# Patient Record
Sex: Female | Born: 1975 | Race: White | Hispanic: No | Smoking: Never smoker
Health system: Southern US, Community
[De-identification: ages and names within clinical notes are randomized; demographics above are authoritative.]

## PROBLEM LIST (undated history)

## (undated) DIAGNOSIS — J45909 Unspecified asthma, uncomplicated: Secondary | ICD-10-CM

## (undated) DIAGNOSIS — F419 Anxiety disorder, unspecified: Secondary | ICD-10-CM

## (undated) DIAGNOSIS — F329 Major depressive disorder, single episode, unspecified: Secondary | ICD-10-CM

## (undated) DIAGNOSIS — O24419 Gestational diabetes mellitus in pregnancy, unspecified control: Secondary | ICD-10-CM

## (undated) DIAGNOSIS — R011 Cardiac murmur, unspecified: Secondary | ICD-10-CM

## (undated) DIAGNOSIS — IMO0001 Reserved for inherently not codable concepts without codable children: Secondary | ICD-10-CM

## (undated) DIAGNOSIS — F32A Depression, unspecified: Secondary | ICD-10-CM

## (undated) DIAGNOSIS — G2581 Restless legs syndrome: Secondary | ICD-10-CM

## (undated) DIAGNOSIS — E559 Vitamin D deficiency, unspecified: Secondary | ICD-10-CM

## (undated) DIAGNOSIS — K449 Diaphragmatic hernia without obstruction or gangrene: Secondary | ICD-10-CM

## (undated) DIAGNOSIS — J189 Pneumonia, unspecified organism: Secondary | ICD-10-CM

## (undated) DIAGNOSIS — K760 Fatty (change of) liver, not elsewhere classified: Secondary | ICD-10-CM

## (undated) DIAGNOSIS — N390 Urinary tract infection, site not specified: Secondary | ICD-10-CM

## (undated) DIAGNOSIS — K219 Gastro-esophageal reflux disease without esophagitis: Secondary | ICD-10-CM

## (undated) DIAGNOSIS — K8020 Calculus of gallbladder without cholecystitis without obstruction: Secondary | ICD-10-CM

## (undated) DIAGNOSIS — K259 Gastric ulcer, unspecified as acute or chronic, without hemorrhage or perforation: Secondary | ICD-10-CM

## (undated) DIAGNOSIS — D509 Iron deficiency anemia, unspecified: Secondary | ICD-10-CM

## (undated) DIAGNOSIS — D649 Anemia, unspecified: Secondary | ICD-10-CM

## (undated) DIAGNOSIS — B029 Zoster without complications: Secondary | ICD-10-CM

## (undated) HISTORY — DX: Calculus of gallbladder without cholecystitis without obstruction: K80.20

## (undated) HISTORY — DX: Gastric ulcer, unspecified as acute or chronic, without hemorrhage or perforation: K25.9

## (undated) HISTORY — PX: WISDOM TOOTH EXTRACTION: SHX21

## (undated) HISTORY — DX: Diaphragmatic hernia without obstruction or gangrene: K44.9

## (undated) HISTORY — DX: Iron deficiency anemia, unspecified: D50.9

## (undated) HISTORY — DX: Gastro-esophageal reflux disease without esophagitis: K21.9

## (undated) HISTORY — DX: Restless legs syndrome: G25.81

## (undated) HISTORY — DX: Depression, unspecified: F32.A

## (undated) HISTORY — DX: Anemia, unspecified: D64.9

## (undated) HISTORY — DX: Fatty (change of) liver, not elsewhere classified: K76.0

## (undated) HISTORY — DX: Major depressive disorder, single episode, unspecified: F32.9

## (undated) HISTORY — DX: Vitamin D deficiency, unspecified: E55.9

## (undated) HISTORY — DX: Unspecified asthma, uncomplicated: J45.909

---

## 2013-07-24 ENCOUNTER — Telehealth: Payer: Self-pay | Admitting: Neurology

## 2013-07-25 ENCOUNTER — Encounter: Payer: Self-pay | Admitting: Neurology

## 2013-07-28 ENCOUNTER — Ambulatory Visit: Payer: Managed Care, Other (non HMO) | Admitting: Neurology

## 2013-12-10 ENCOUNTER — Encounter: Payer: Self-pay | Admitting: Gastroenterology

## 2014-02-16 ENCOUNTER — Ambulatory Visit: Payer: Self-pay | Admitting: Gastroenterology

## 2014-02-17 NOTE — Telephone Encounter (Signed)
Error

## 2014-02-17 NOTE — Progress Notes (Signed)
Patient ID: Leslie Robinson, female   DOB: 1975/11/08, 38 y.o.   MRN: [id number]   The patient's chart has been reviewed by Dr. Fuller Plan and the recommendations are noted below.  Follow-up advised. Contact patient and schedule visit at first available appointment.  Outcome of communication with the patient: Pt's numbers are disconnected. Will send letter.

## 2014-11-27 HISTORY — PX: TUBAL LIGATION: SHX77

## 2014-12-16 ENCOUNTER — Encounter: Payer: Self-pay | Admitting: Internal Medicine

## 2015-01-05 ENCOUNTER — Encounter: Payer: Self-pay | Admitting: *Deleted

## 2015-01-12 ENCOUNTER — Ambulatory Visit: Payer: Self-pay | Admitting: Internal Medicine

## 2015-03-10 ENCOUNTER — Ambulatory Visit (INDEPENDENT_AMBULATORY_CARE_PROVIDER_SITE_OTHER): Payer: BLUE CROSS/BLUE SHIELD | Admitting: Internal Medicine

## 2015-03-10 ENCOUNTER — Encounter: Payer: Self-pay | Admitting: Internal Medicine

## 2015-03-10 ENCOUNTER — Other Ambulatory Visit (INDEPENDENT_AMBULATORY_CARE_PROVIDER_SITE_OTHER): Payer: BLUE CROSS/BLUE SHIELD

## 2015-03-10 VITALS — BP 144/86 | HR 80 | Ht 64.0 in | Wt 220.1 lb

## 2015-03-10 DIAGNOSIS — Z8371 Family history of colonic polyps: Secondary | ICD-10-CM | POA: Diagnosis not present

## 2015-03-10 DIAGNOSIS — R195 Other fecal abnormalities: Secondary | ICD-10-CM

## 2015-03-10 DIAGNOSIS — D509 Iron deficiency anemia, unspecified: Secondary | ICD-10-CM

## 2015-03-10 DIAGNOSIS — D649 Anemia, unspecified: Secondary | ICD-10-CM | POA: Diagnosis not present

## 2015-03-10 LAB — IBC PANEL
IRON: 48 ug/dL (ref 42–145)
Saturation Ratios: 9.9 % — ABNORMAL LOW (ref 20.0–50.0)
Transferrin: 346 mg/dL (ref 212.0–360.0)

## 2015-03-10 LAB — CBC WITH DIFFERENTIAL/PLATELET
BASOS PCT: 0.5 % (ref 0.0–3.0)
Basophils Absolute: 0 10*3/uL (ref 0.0–0.1)
EOS PCT: 0.5 % (ref 0.0–5.0)
Eosinophils Absolute: 0 10*3/uL (ref 0.0–0.7)
HEMATOCRIT: 32.5 % — AB (ref 36.0–46.0)
HEMOGLOBIN: 11 g/dL — AB (ref 12.0–15.0)
LYMPHS PCT: 19.4 % (ref 12.0–46.0)
Lymphs Abs: 1.1 10*3/uL (ref 0.7–4.0)
MCHC: 33.8 g/dL (ref 30.0–36.0)
MCV: 92 fl (ref 78.0–100.0)
MONO ABS: 0.2 10*3/uL (ref 0.1–1.0)
MONOS PCT: 4.1 % (ref 3.0–12.0)
Neutro Abs: 4.5 10*3/uL (ref 1.4–7.7)
Neutrophils Relative %: 75.5 % (ref 43.0–77.0)
Platelets: 247 10*3/uL (ref 150.0–400.0)
RBC: 3.53 Mil/uL — AB (ref 3.87–5.11)
RDW: 16.3 % — AB (ref 11.5–15.5)
WBC: 5.9 10*3/uL (ref 4.0–10.5)

## 2015-03-10 LAB — FERRITIN: FERRITIN: 8.8 ng/mL — AB (ref 10.0–291.0)

## 2015-03-10 LAB — IGA: IGA: 297 mg/dL (ref 68–378)

## 2015-03-10 MED ORDER — NA SULFATE-K SULFATE-MG SULF 17.5-3.13-1.6 GM/177ML PO SOLN
ORAL | Status: DC
Start: 2015-03-10 — End: 2015-06-09

## 2015-03-10 NOTE — Progress Notes (Signed)
Patient ID: Leslie Robinson, female   DOB: [birth year], 39 y.o.   MRN: [id number] HPI: Leslie Robinson is a 39 year old with past medical history of IDA, GERD, vitamin D deficiency and restless leg who seen in consultation at the request of Dr. Laney Pastor to evaluate heme positive stool and iron deficiency anemia. She is here alone today. She reports that overall she feels okay but she has been dealing with fatigue and generalized weakness for about 1 year. She reports that she has had persistent anemia despite oral iron supplementation. She is taking ferrous sulfate 325 mg 3 or 4 days a week. She has been stopping it after 3 or 4 days because of constipation. Stool softeners have not helped. She does report heavy menstrual periods lasting 7 days but this is not a change for her. She has not seen blood in her stool or melena. Bowel movements a been regular except darker when using iron and more hard stool when using iron. No diarrhea. No abdominal pain. No upper GI complaint including heartburn, dysphagia or odynophagia. She denies early satiety, nausea and vomiting. Family history notable for mother with colon polyps, multiple maternal cousins with colon polyps and a maternal grandmother with colon cancer at an early age, age unknown but less than 62 years old. No known family history of celiac disease. She had a colonoscopy performed approximately 15 years ago by Dr. Docia Furl which was reportedly normal. 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 She recently had a baby in May 2016. She also has an 15 and 17 year old child. Does not use tobacco or alcohol. She is recently started taking phentermine for weight loss though has not found it effective.  Past Medical History  Diagnosis Date  . Anemia   . Iron deficiency anemia   . Restless leg   . Vitamin D deficiency   . GERD (gastroesophageal reflux disease)     Past Surgical History  Procedure Laterality Date  . Tubal  ligation  11-27-2014    Outpatient Prescriptions Prior to Visit  Medication Sig Dispense Refill  . pramipexole (MIRAPEX) 0.125 MG tablet Take 0.125 mg by mouth daily. Once daily taken 2-3 hours before bedtime    . amitriptyline (ELAVIL) 25 MG tablet Take 25 mg by mouth at bedtime.    . clotrimazole-betamethasone (LOTRISONE) cream Apply 1 application topically 2 (two) times daily. Apply small amount to affected area    . miconazole (ZEASORB-AF) 2 % powder Apply topically as needed for itching. Apply to affected to area bid    . oxyCODONE-acetaminophen (PERCOCET) 10-325 MG per tablet Take 1 tablet by mouth daily. One tablet po qhs for pain     No facility-administered medications prior to visit.    No Known Allergies  Family History  Problem Relation Age of Onset  . Hypertension Mother   . Colon cancer Maternal Grandmother   . Colon polyps Mother   . Diabetes Mother   . Heart disease Mother     Social History  Substance Use Topics  . Smoking status: Never Smoker   . Smokeless tobacco: Never Used  . Alcohol Use: No    ROS: As per history of present illness, otherwise negative  BP 144/86 mmHg  Pulse 80  Ht 5\' 4"  (1.626 m)  Wt 220 lb 2 oz (99.848 kg)  BMI 37.77 kg/m2  LMP 02/14/2015 (Approximate) Constitutional: Well-developed and well-nourished. No distress. HEENT: Normocephalic and atraumatic. Oropharynx is clear and moist. No oropharyngeal exudate. Conjunctivae are  normal.  No scleral icterus. Neck: Neck supple. Trachea midline. Cardiovascular: Normal rate, regular rhythm and intact distal pulses. No M/R/G Pulmonary/chest: Effort normal and breath sounds normal. No wheezing, rales or rhonchi. Abdominal: Soft, nontender, nondistended. Bowel sounds active throughout. There are no masses palpable. No hepatosplenomegaly. Extremities: no clubbing, cyanosis, or edema Lymphadenopathy: No cervical adenopathy noted. Neurological: Alert and oriented to person place and time. Skin:  Skin is warm and dry. No rashes noted. Psychiatric: Normal mood and affect. Behavior is normal.  RELEVANT LABS AND IMAGING: CBC    Component Value Date/Time   WBC 5.9 03/10/2015 0948   RBC 3.53* 03/10/2015 0948   HGB 11.0* 03/10/2015 0948   HCT 32.5* 03/10/2015 0948   PLT 247.0 03/10/2015 0948   MCV 92.0 03/10/2015 0948   MCHC 33.8 03/10/2015 0948   RDW 16.3* 03/10/2015 0948   LYMPHSABS 1.1 03/10/2015 0948   MONOABS 0.2 03/10/2015 0948   EOSABS 0.0 03/10/2015 0948   BASOSABS 0.0 03/10/2015 0948   Labs reviewed from primary care 12/11/2014, hemoglobin 10.6, hepatic function panel normal TSH normal B12 normal Ferritin 17 in January 2015 Vitamin D was low and replaced  FOBT positive, patient reports these were submitted from home  ASSESSMENT/PLAN: 39 year old with past medical history of IDA, GERD, vitamin D deficiency and restless leg who seen in consultation at the request of Dr. Laney Pastor to evaluate heme positive stool and iron deficiency anemia.  1. IDA with heme positive stools -- I have recommended upper endoscopy and colonoscopy to evaluate iron deficiency and heme-positive stool. Her family history is notable for colon polyps and cancer on her mother's side, another reason for colonoscopy. We discussed the risks, benefits and alternatives and she is agreeable to proceed. I would like to repeat CBC, iron studies and check a celiac panel today. I would like her to resume oral iron with ferrous sulfate 325 mg once daily and add MiraLAX 17 g daily to avoid constipation. If oral iron replacement as an effective she may need IV iron. Lastly she will need to stop phentermine before her procedures.    TM:LYYT David Hungarland, [place] [address]

## 2015-03-10 NOTE — Patient Instructions (Addendum)
You have been scheduled for an endoscopy and colonoscopy. Please follow the written instructions given to you at your visit today. Please pick up your prep supplies at the pharmacy within the next 1-3 days. If you use inhalers (even only as needed), please bring them with you on the day of your procedure. Your physician has requested that you go to www.startemmi.com and enter the access code given to you at your visit today. This web site gives a general overview about your procedure. However, you should still follow specific instructions given to you by our office regarding your preparation for the procedure.  Your physician has requested that you go to the basement for the following lab work before leaving today: CBC, IBC, Celiac  Hold Mirapex and adipex 10 days prior to test.  Please purchase the following medications over the counter and take as directed: Miralax 17 grams (1 capful) daily

## 2015-03-11 LAB — TISSUE TRANSGLUTAMINASE, IGA: Tissue Transglutaminase Ab, IgA: 1 U/mL (ref <4)

## 2015-05-10 ENCOUNTER — Encounter: Payer: BLUE CROSS/BLUE SHIELD | Admitting: Internal Medicine

## 2015-05-23 DIAGNOSIS — J189 Pneumonia, unspecified organism: Secondary | ICD-10-CM

## 2015-05-23 HISTORY — DX: Pneumonia, unspecified organism: J18.9

## 2015-06-09 ENCOUNTER — Ambulatory Visit (AMBULATORY_SURGERY_CENTER): Payer: BLUE CROSS/BLUE SHIELD | Admitting: Internal Medicine

## 2015-06-09 ENCOUNTER — Encounter: Payer: Self-pay | Admitting: Internal Medicine

## 2015-06-09 ENCOUNTER — Other Ambulatory Visit (INDEPENDENT_AMBULATORY_CARE_PROVIDER_SITE_OTHER): Payer: BLUE CROSS/BLUE SHIELD

## 2015-06-09 VITALS — BP 161/87 | HR 84 | Temp 98.5°F | Resp 18 | Ht 64.0 in | Wt 220.0 lb

## 2015-06-09 DIAGNOSIS — K210 Gastro-esophageal reflux disease with esophagitis: Secondary | ICD-10-CM | POA: Diagnosis not present

## 2015-06-09 DIAGNOSIS — D509 Iron deficiency anemia, unspecified: Secondary | ICD-10-CM | POA: Diagnosis present

## 2015-06-09 DIAGNOSIS — Z800 Family history of malignant neoplasm of digestive organs: Secondary | ICD-10-CM | POA: Diagnosis not present

## 2015-06-09 LAB — CBC
HCT: 32.5 % — ABNORMAL LOW (ref 36.0–46.0)
Hemoglobin: 10.9 g/dL — ABNORMAL LOW (ref 12.0–15.0)
MCHC: 33.4 g/dL (ref 30.0–36.0)
MCV: 92.9 fl (ref 78.0–100.0)
Platelets: 263 10*3/uL (ref 150.0–400.0)
RBC: 3.5 Mil/uL — AB (ref 3.87–5.11)
RDW: 16.8 % — ABNORMAL HIGH (ref 11.5–15.5)
WBC: 5.6 10*3/uL (ref 4.0–10.5)

## 2015-06-09 LAB — IBC PANEL
IRON: 106 ug/dL (ref 42–145)
SATURATION RATIOS: 21.8 % (ref 20.0–50.0)
TRANSFERRIN: 348 mg/dL (ref 212.0–360.0)

## 2015-06-09 MED ORDER — PANTOPRAZOLE SODIUM 40 MG PO TBEC: 40.0000 mg | DELAYED_RELEASE_TABLET | Freq: Every day | ORAL | Status: DC

## 2015-06-09 MED ORDER — SODIUM CHLORIDE 0.9 % IV SOLN: 500.0000 mL | INTRAVENOUS | Status: DC

## 2015-06-09 NOTE — Progress Notes (Signed)
Called to room to assist during endoscopic procedure.  Patient ID and intended procedure confirmed with present staff. Received instructions for my participation in the procedure from the performing physician.  

## 2015-06-09 NOTE — Op Note (Signed)
[facility]  [address] [place], [id number]   COLONOSCOPY PROCEDURE REPORT  PATIENT: Leslie Robinson, Leslie Robinson  MR#: [id number] BIRTHDATE: 02-25-1976 , 39  yrs. old GENDER: female ENDOSCOPIST: Jerene Bears, MD REFERRED BY: [place] PROCEDURE DATE:  06/09/2015 PROCEDURE:   Colonoscopy, diagnostic First Screening Colonoscopy - Avg.  risk and is 50 yrs.  old or older - No.  Prior Negative Screening - Now for repeat screening. N/A  History of Adenoma - Now for follow-up colonoscopy & has been > or = to 3 yrs.  N/A  Polyps removed today? No Recommend repeat exam, <10 yrs? Yes high risk ASA CLASS:   Class II INDICATIONS:iron deficiency anemia and family history of colon polyps. MEDICATIONS: Monitored anesthesia care, Propofol 700 mg IV, and this was the total dose used for all procedures at this session  DESCRIPTION OF PROCEDURE:   After the risks benefits and alternatives of the procedure were thoroughly explained, informed consent was obtained.  The digital rectal exam revealed no rectal mass.   The [id number]  endoscope was introduced through the anus and advanced to the cecum, which was identified by both the appendix and ileocecal valve. No adverse events experienced. The quality of the prep was good.  (Suprep was used)  The instrument was then slowly withdrawn as the colon was fully examined. Estimated blood loss is zero unless otherwise noted in this procedure report.      COLON FINDINGS: There was mild diverticulosis noted in the ascending colon, at the hepatic flexure, and splenic flexure.   The colonic mucosa appeared normal throughout the entire examined colon. Retroflexed views revealed hypertrophied anal papillae. The time to cecum = 3.5 Withdrawal time = 11.3   The scope was withdrawn and the procedure completed. COMPLICATIONS: There were no immediate complications.  ENDOSCOPIC IMPRESSION: 1.   Mild diverticulosis was noted in the ascending  colon, at the hepatic flexure, and splenic flexure 2.   The colonic mucosa appeared normal throughout the entire examined colon  RECOMMENDATIONS: 1.  High fiber diet 2.  Repeat Colonoscopy in 5 years. 3.  Await biopsies from EGD, if persist iron deficiency anemia, consider capsule endoscopy  eSigned:  Jerene Bears, MD 06/09/2015 4:21 PM   cc:  the patient, Dr. Margo Aye

## 2015-06-09 NOTE — Op Note (Signed)
[facility]  [address] [place], [id number]   ENDOSCOPY PROCEDURE REPORT  PATIENT: Leslie, Robinson  MR#: [id number] BIRTHDATE: [birth year] , 39  yrs. old GENDER: female ENDOSCOPIST: Jerene Bears, MD REFERRED BY:  Dr. Margo Aye PROCEDURE DATE:  06/09/2015 PROCEDURE:  EGD, diagnostic and EGD w/ biopsy ASA CLASS:     Class II INDICATIONS:  iron deficiency anemia. MEDICATIONS: Monitored anesthesia care and Propofol 300 mg IV TOPICAL ANESTHETIC: none  DESCRIPTION OF PROCEDURE: After the risks benefits and alternatives of the procedure were thoroughly explained, informed consent was obtained.  The [id number] endoscope was introduced through the mouth and advanced to the second portion of the duodenum , Without limitations.  The instrument was slowly withdrawn as the mucosa was fully examined.   ESOPHAGUS: Mild reflux esophagitis was found at the gastroesophageal junction.  STOMACH: A 3 cm hiatal hernia was noted.   Three linear erosions were found in the gastric body, 1 with scant adherent heme.  Cold forcep biopsies were taken at the gastric body, antrum and angularis to evaluate for h.  pylori.  DUODENUM: The duodenal mucosa showed no abnormalities in the bulb and 2nd part of the duodenum.  Cold forcep biopsies were taken in the second portion.  Retroflexed views revealed a hiatal hernia.     The scope was then withdrawn from the patient and the procedure completed.  COMPLICATIONS: There were no immediate complications.     ENDOSCOPIC IMPRESSION: 1.   Reflux esophagitis at the gastroesophageal junction 2.   3 cm hiatal hernia 3.   Erosions were found in the gastric body; multiple biopsies 4.   The duodenal mucosa showed no abnormalities in the bulb and 2nd part of the duodenum cold forcep biopsies were taken in the second portion  RECOMMENDATIONS: 1.  Await biopsy results 2.  Pantoprazole 40 mg daily x 1 month 3.  Follow-up of  helicobacter pylori status, treat if indicated 4.  Proceed with colonoscopy   eSigned:  Jerene Bears, MD 06/09/2015 4:16 PM    CC: the patient, PCP  PATIENT NAME:  Leslie, Robinson MR#: [id number]

## 2015-06-09 NOTE — Progress Notes (Signed)
To recovery, report to Myers, RN, VSS. 

## 2015-06-09 NOTE — Patient Instructions (Addendum)

## 2015-06-10 ENCOUNTER — Telehealth: Payer: Self-pay | Admitting: *Deleted

## 2015-06-10 ENCOUNTER — Other Ambulatory Visit: Payer: Self-pay

## 2015-06-10 ENCOUNTER — Telehealth: Payer: Self-pay

## 2015-06-10 DIAGNOSIS — D509 Iron deficiency anemia, unspecified: Secondary | ICD-10-CM

## 2015-06-10 LAB — FERRITIN: Ferritin: 15.3 ng/mL (ref 10.0–291.0)

## 2015-06-10 NOTE — Telephone Encounter (Signed)
  Follow up Call-  Call back number 06/09/2015  Post procedure Call Back phone  # [phone]  Permission to leave phone message Yes     Patient questions:  Do you have a fever, pain , or abdominal swelling? No. Pain Score  0 *  Have you tolerated food without any problems? Yes.    Have you been able to return to your normal activities? Yes.    Do you have any questions about your discharge instructions: Diet   No. Medications  No. Follow up visit  No.  Do you have questions or concerns about your Care? No.  Actions: * If pain score is 4 or above: No action needed, pain <4.

## 2015-06-10 NOTE — Telephone Encounter (Signed)
Spoke with pt regarding lab results, see result note.  

## 2015-06-16 ENCOUNTER — Encounter: Payer: Self-pay | Admitting: Internal Medicine

## 2015-08-30 ENCOUNTER — Telehealth: Payer: Self-pay

## 2015-08-30 NOTE — Telephone Encounter (Signed)
-----   Message from Algernon Huxley, RN sent at 06/10/2015 10:37 AM EST ----- Regarding: Labs Pt needs labs in 3 mths, order in epic.

## 2015-08-30 NOTE — Telephone Encounter (Signed)
Pt aware.

## 2015-09-10 ENCOUNTER — Other Ambulatory Visit (INDEPENDENT_AMBULATORY_CARE_PROVIDER_SITE_OTHER): Payer: BLUE CROSS/BLUE SHIELD

## 2015-09-10 ENCOUNTER — Other Ambulatory Visit: Payer: Self-pay

## 2015-09-10 DIAGNOSIS — D509 Iron deficiency anemia, unspecified: Secondary | ICD-10-CM | POA: Diagnosis not present

## 2015-09-10 LAB — CBC WITH DIFFERENTIAL/PLATELET
Basophils Absolute: 0 10*3/uL (ref 0.0–0.1)
Basophils Relative: 0.5 % (ref 0.0–3.0)
EOS PCT: 1.4 % (ref 0.0–5.0)
Eosinophils Absolute: 0.1 10*3/uL (ref 0.0–0.7)
HCT: 31 % — ABNORMAL LOW (ref 36.0–46.0)
HEMOGLOBIN: 10.7 g/dL — AB (ref 12.0–15.0)
LYMPHS PCT: 30.4 % (ref 12.0–46.0)
Lymphs Abs: 1.4 10*3/uL (ref 0.7–4.0)
MCHC: 34.3 g/dL (ref 30.0–36.0)
MCV: 90.8 fl (ref 78.0–100.0)
MONO ABS: 0.3 10*3/uL (ref 0.1–1.0)
MONOS PCT: 7.1 % (ref 3.0–12.0)
Neutro Abs: 2.8 10*3/uL (ref 1.4–7.7)
Neutrophils Relative %: 60.6 % (ref 43.0–77.0)
Platelets: 260 10*3/uL (ref 150.0–400.0)
RBC: 3.42 Mil/uL — AB (ref 3.87–5.11)
RDW: 17 % — ABNORMAL HIGH (ref 11.5–15.5)
WBC: 4.6 10*3/uL (ref 4.0–10.5)

## 2015-09-10 LAB — IBC PANEL
IRON: 60 ug/dL (ref 42–145)
Saturation Ratios: 12.2 % — ABNORMAL LOW (ref 20.0–50.0)
Transferrin: 350 mg/dL (ref 212.0–360.0)

## 2015-09-10 LAB — FERRITIN: FERRITIN: 9.1 ng/mL — AB (ref 10.0–291.0)

## 2015-09-24 ENCOUNTER — Ambulatory Visit (INDEPENDENT_AMBULATORY_CARE_PROVIDER_SITE_OTHER): Payer: Managed Care, Other (non HMO) | Admitting: Internal Medicine

## 2015-09-24 DIAGNOSIS — D500 Iron deficiency anemia secondary to blood loss (chronic): Secondary | ICD-10-CM

## 2015-09-24 NOTE — Progress Notes (Signed)
Patient here for capsule endo .  Capsule ID [id number] exp 07/01/16 lot # [id number]

## 2015-09-30 ENCOUNTER — Encounter: Payer: Self-pay | Admitting: Internal Medicine

## 2015-10-01 ENCOUNTER — Other Ambulatory Visit: Payer: Self-pay

## 2015-10-01 ENCOUNTER — Telehealth: Payer: Self-pay

## 2015-10-01 DIAGNOSIS — M795 Residual foreign body in soft tissue: Secondary | ICD-10-CM

## 2015-10-01 DIAGNOSIS — D649 Anemia, unspecified: Secondary | ICD-10-CM

## 2015-10-01 NOTE — Telephone Encounter (Signed)
Capsule endo shows a shallow gastric ulcer, otherwise unremarkable. Fe def anemia may be due to ooze from gastric ulcer. Will check h pylori ab. Needs daily PPI, oral iron. Repeat CBC and iron studies in 3 mth. If remain iron def IV iron replacement may be indicated. Left message for pt to call back.

## 2015-10-01 NOTE — Telephone Encounter (Signed)
Patient contacted to make sure she has passed capsule endo.  She is not sure that she has seen the capsule in her stool.  She will come for a KUB at her convenience

## 2015-10-01 NOTE — Telephone Encounter (Signed)
-----   Message from Marlon Pel, RN sent at 09/24/2015  4:01 PM EDT ----- Check if she passed capsule

## 2015-10-07 ENCOUNTER — Other Ambulatory Visit: Payer: Self-pay

## 2015-10-07 DIAGNOSIS — K259 Gastric ulcer, unspecified as acute or chronic, without hemorrhage or perforation: Secondary | ICD-10-CM

## 2015-10-07 DIAGNOSIS — D509 Iron deficiency anemia, unspecified: Secondary | ICD-10-CM

## 2015-10-07 NOTE — Telephone Encounter (Signed)
Pt aware, orders and reminder in epic. 

## 2015-10-11 ENCOUNTER — Telehealth: Payer: Self-pay | Admitting: Internal Medicine

## 2015-10-11 NOTE — Telephone Encounter (Signed)
Pt wanted to know if she could be tired from anemia. Discussed with pt that anemia can make you tired and that she needs to continue taking her iron supplements. Pt states she is coming at the end of the week to have her lab for H pylori done.

## 2015-10-15 ENCOUNTER — Other Ambulatory Visit (INDEPENDENT_AMBULATORY_CARE_PROVIDER_SITE_OTHER): Payer: Managed Care, Other (non HMO)

## 2015-10-15 DIAGNOSIS — D509 Iron deficiency anemia, unspecified: Secondary | ICD-10-CM

## 2015-10-15 DIAGNOSIS — K259 Gastric ulcer, unspecified as acute or chronic, without hemorrhage or perforation: Secondary | ICD-10-CM

## 2015-10-15 LAB — H. PYLORI ANTIBODY, IGG: H PYLORI IGG: NEGATIVE

## 2015-12-02 ENCOUNTER — Emergency Department (HOSPITAL_COMMUNITY): Payer: Managed Care, Other (non HMO)

## 2015-12-02 ENCOUNTER — Emergency Department (HOSPITAL_COMMUNITY)
Admission: EM | Admit: 2015-12-02 | Discharge: 2015-12-02 | Disposition: A | Discharge status: Home / Self Care | Payer: Managed Care, Other (non HMO) | Attending: Emergency Medicine | Admitting: Emergency Medicine

## 2015-12-02 ENCOUNTER — Encounter (HOSPITAL_COMMUNITY): Payer: Self-pay | Admitting: Cardiology

## 2015-12-02 DIAGNOSIS — R52 Pain, unspecified: Secondary | ICD-10-CM

## 2015-12-02 DIAGNOSIS — R079 Chest pain, unspecified: Secondary | ICD-10-CM | POA: Diagnosis present

## 2015-12-02 DIAGNOSIS — B009 Herpesviral infection, unspecified: Secondary | ICD-10-CM | POA: Diagnosis not present

## 2015-12-02 DIAGNOSIS — K8010 Calculus of gallbladder with chronic cholecystitis without obstruction: Secondary | ICD-10-CM | POA: Diagnosis not present

## 2015-12-02 DIAGNOSIS — Z79899 Other long term (current) drug therapy: Secondary | ICD-10-CM | POA: Insufficient documentation

## 2015-12-02 DIAGNOSIS — R0602 Shortness of breath: Secondary | ICD-10-CM | POA: Insufficient documentation

## 2015-12-02 DIAGNOSIS — K8020 Calculus of gallbladder without cholecystitis without obstruction: Secondary | ICD-10-CM

## 2015-12-02 DIAGNOSIS — J45909 Unspecified asthma, uncomplicated: Secondary | ICD-10-CM | POA: Diagnosis not present

## 2015-12-02 HISTORY — DX: Zoster without complications: B02.9

## 2015-12-02 LAB — CBC
HEMATOCRIT: 32.9 % — AB (ref 36.0–46.0)
Hemoglobin: 10.9 g/dL — ABNORMAL LOW (ref 12.0–15.0)
MCH: 30.4 pg (ref 26.0–34.0)
MCHC: 33.1 g/dL (ref 30.0–36.0)
MCV: 91.9 fL (ref 78.0–100.0)
Platelets: 259 10*3/uL (ref 150–400)
RBC: 3.58 MIL/uL — ABNORMAL LOW (ref 3.87–5.11)
RDW: 15.8 % — AB (ref 11.5–15.5)
WBC: 5.2 10*3/uL (ref 4.0–10.5)

## 2015-12-02 LAB — URINALYSIS, ROUTINE W REFLEX MICROSCOPIC
BILIRUBIN URINE: NEGATIVE
GLUCOSE, UA: NEGATIVE mg/dL
HGB URINE DIPSTICK: NEGATIVE
Ketones, ur: NEGATIVE mg/dL
Leukocytes, UA: NEGATIVE
Nitrite: NEGATIVE
Protein, ur: NEGATIVE mg/dL
SPECIFIC GRAVITY, URINE: 1.015 (ref 1.005–1.030)
pH: 6 (ref 5.0–8.0)

## 2015-12-02 LAB — COMPREHENSIVE METABOLIC PANEL
ALT: 15 U/L (ref 14–54)
ANION GAP: 8 (ref 5–15)
AST: 15 U/L (ref 15–41)
Albumin: 3.8 g/dL (ref 3.5–5.0)
Alkaline Phosphatase: 90 U/L (ref 38–126)
BILIRUBIN TOTAL: 0.5 mg/dL (ref 0.3–1.2)
BUN: 11 mg/dL (ref 6–20)
CALCIUM: 8.6 mg/dL — AB (ref 8.9–10.3)
CO2: 25 mmol/L (ref 22–32)
Chloride: 104 mmol/L (ref 101–111)
Creatinine, Ser: 0.67 mg/dL (ref 0.44–1.00)
GFR calc Af Amer: >60 mL/min (ref >60)
Glucose, Bld: 139 mg/dL — ABNORMAL HIGH (ref 65–99)
POTASSIUM: 3.5 mmol/L (ref 3.5–5.1)
Sodium: 137 mmol/L (ref 135–145)
TOTAL PROTEIN: 6.9 g/dL (ref 6.5–8.1)

## 2015-12-02 LAB — PROTIME-INR
INR: 0.95 (ref 0.00–1.49)
PROTHROMBIN TIME: 12.9 s (ref 11.6–15.2)

## 2015-12-02 LAB — PREGNANCY, URINE: PREG TEST UR: NEGATIVE

## 2015-12-02 LAB — LIPASE, BLOOD: Lipase: 35 U/L (ref 11–51)

## 2015-12-02 LAB — TROPONIN I: Troponin I: <0.03 ng/mL (ref <0.03)

## 2015-12-02 MED ORDER — ONDANSETRON HCL 4 MG PO TABS: 4.0000 mg | ORAL_TABLET | Freq: Four times a day (QID) | ORAL | Status: DC

## 2015-12-02 MED ORDER — LORAZEPAM 2 MG/ML IJ SOLN
1.0000 mg | Freq: Once | INTRAMUSCULAR | Status: AC
  Administered 2015-12-02: 1 mg via INTRAVENOUS
  Filled 2015-12-02: qty 1

## 2015-12-02 MED ORDER — TRIFLURIDINE 1 % OP SOLN: 1.0000 [drp] | OPHTHALMIC | Status: DC

## 2015-12-02 MED ORDER — SODIUM CHLORIDE 0.9 % IV BOLUS (SEPSIS)
1000.0000 mL | Freq: Once | INTRAVENOUS | Status: AC
  Administered 2015-12-02: 1000 mL via INTRAVENOUS

## 2015-12-02 MED ORDER — HYDROCODONE-ACETAMINOPHEN 5-325 MG PO TABS: 1.0000 | ORAL_TABLET | ORAL | Status: DC | PRN

## 2015-12-02 MED ORDER — IOPAMIDOL (ISOVUE-370) INJECTION 76%
100.0000 mL | Freq: Once | INTRAVENOUS | Status: AC | PRN
  Administered 2015-12-02: 100 mL via INTRAVENOUS

## 2015-12-02 MED ORDER — ONDANSETRON HCL 4 MG/2ML IJ SOLN
4.0000 mg | Freq: Once | INTRAMUSCULAR | Status: AC
  Administered 2015-12-02: 4 mg via INTRAVENOUS
  Filled 2015-12-02: qty 2

## 2015-12-02 MED ORDER — SODIUM CHLORIDE 0.9 % IV SOLN: INTRAVENOUS | Status: DC

## 2015-12-02 MED ORDER — ASPIRIN 81 MG PO CHEW
324.0000 mg | CHEWABLE_TABLET | Freq: Once | ORAL | Status: AC
  Administered 2015-12-02: 324 mg via ORAL
  Filled 2015-12-02: qty 4

## 2015-12-02 MED ORDER — MORPHINE SULFATE (PF) 4 MG/ML IV SOLN
4.0000 mg | Freq: Once | INTRAVENOUS | Status: AC
  Administered 2015-12-02: 4 mg via INTRAVENOUS
  Filled 2015-12-02: qty 1

## 2015-12-02 NOTE — ED Notes (Signed)
Chest pain that started last night.  Currently being treated for shingles.

## 2015-12-02 NOTE — Discharge Instructions (Signed)
Biliary Colic °Biliary colic is a pain in the upper abdomen. The pain: °· Is usually felt on the right side of the abdomen, but it may also be felt in the center of the abdomen, just below the breastbone (sternum). °· May spread back toward the right shoulder blade. °· May be steady or irregular. °· May be accompanied by nausea and vomiting. °Most of the time, the pain goes away in 1-5 hours. After the most intense pain passes, the abdomen may continue to ache mildly for about 24 hours. °Biliary colic is caused by a blockage in the bile duct. The bile duct is a pathway that carries bile--a liquid that helps to digest fats--from the gallbladder to the small intestine. Biliary colic usually occurs after eating, when the digestive system demands bile. The pain develops when muscle cells contract forcefully to try to move the blockage so that bile can get by. °HOME CARE INSTRUCTIONS °· Take medicines only as directed by your health care provider. °· Drink enough fluid to keep your urine clear or pale yellow. °· Avoid fatty, greasy, and fried foods. These kinds of foods increase your body's demand for bile. °· Avoid any foods that make your pain worse. °· Avoid overeating. °· Avoid having a large meal after fasting. °SEEK MEDICAL CARE IF: °· You develop a fever. °· Your pain gets worse. °· You vomit. °· You develop nausea that prevents you from eating and drinking. °SEEK IMMEDIATE MEDICAL CARE IF: °· You suddenly develop a fever and shaking chills. °· You develop a yellowish discoloration (jaundice) of: °¨ Skin. °¨ Whites of the eyes. °¨ Mucous membranes. °· You have continuous or severe pain that is not relieved with medicines. °· You have nausea and vomiting that is not relieved with medicines. °· You develop dizziness or you faint. °  °This information is not intended to replace advice given to you by your health care provider. Make sure you discuss any questions you have with your health care provider. °  °Document  Released: 10/09/2005 Document Revised: 09/22/2014 Document Reviewed: 02/17/2014 °[employer]vier Interactive Patient Education ©2016 [employer] ° °

## 2015-12-02 NOTE — ED Provider Notes (Signed)
CSN: [id number]     Arrival date & time 12/02/15  [id number] History  By signing my name below, I, Higinio Plan, attest that this documentation has been prepared under the direction and in the presence of Isla Pence, MD . Electronically Signed: Higinio Plan, Scribe. 12/02/2015. 9:13 AM.   Chief Complaint  Patient presents with  . Chest Pain   The history is provided by the patient. No language interpreter was used.   HPI Comments: Leslie Robinson is a 40 y.o. female with PMHx of asthma, anemia and GERD, who presents to the Emergency Department complaining of gradually worsening, chest pain and chest tightness that began at 8:15am this morning and worsened PTA. Pt notes associated shortness of breath. She states this is the first time she has experienced these symptoms. Pt notes she has never had her heart checked. She states she took aspirin for her symptoms yesterday with mild relief. Pt notes she was also diagnosed with shingles on Saturday; she states she is taking 60 mg cymbalta once a day. Pt denies hx of smoking. She states everyone on her mother's side has experienced heart problems, many at a young age.   Past Medical History  Diagnosis Date  . Anemia   . Iron deficiency anemia   . Restless leg   . Vitamin D deficiency   . GERD (gastroesophageal reflux disease)   . Asthma     childhood  . Shingles    Past Surgical History  Procedure Laterality Date  . Tubal ligation  11-27-2014   Family History  Problem Relation Age of Onset  . Hypertension Mother   . Colon cancer Maternal Grandmother   . Colon polyps Mother   . Diabetes Mother   . Heart disease Mother    Social History  Substance Use Topics  . Smoking status: Never Smoker   . Smokeless tobacco: Never Used  . Alcohol Use: No   OB History    No data available     Review of Systems  Respiratory: Positive for chest tightness and shortness of breath.   Cardiovascular: Positive for chest pain.  All other systems reviewed and  are negative.   Allergies  Review of patient's allergies indicates no known allergies.  Home Medications   Prior to Admission medications   Medication Sig Start Date End Date Taking? Authorizing Provider  acyclovir (ZOVIRAX) 800 MG tablet Take 800 mg by mouth 5 (five) times daily.   Yes Historical Provider, MD  DULoxetine (CYMBALTA) 60 MG capsule Take 60 mg by mouth daily.   Yes Historical Provider, MD  ferrous sulfate 325 (65 FE) MG tablet Take 325 mg by mouth daily with breakfast. Reported on 06/09/2015   Yes Historical Provider, MD  pramipexole (MIRAPEX) 0.125 MG tablet Take 0.125 mg by mouth daily. Reported on 06/09/2015   Yes Historical Provider, MD  predniSONE (DELTASONE) 20 MG tablet Take 20 mg by mouth 3 (three) times daily.   Yes Historical Provider, MD  HYDROcodone-acetaminophen (NORCO/VICODIN) 5-325 MG tablet Take 1 tablet by mouth every 4 (four) hours as needed. 12/02/15   Isla Pence, MD  ondansetron (ZOFRAN) 4 MG tablet Take 1 tablet (4 mg total) by mouth every 6 (six) hours. 12/02/15   Isla Pence, MD  pantoprazole (PROTONIX) 40 MG tablet Take 1 tablet (40 mg total) by mouth daily. Patient not taking: Reported on 12/02/2015 06/09/15   Jerene Bears, MD  trifluridine (VIROPTIC) 1 % ophthalmic solution Place 1 drop into the right eye every 2 (two)  hours while awake. 12/02/15   Isla Pence, MD   BP 131/90 mmHg  Pulse 103  Temp(Src) 98.5 F (36.9 C) (Oral)  Resp 16  Ht 5\' 3"  (1.6 m)  Wt 222 lb (100.699 kg)  BMI 39.34 kg/m2  SpO2 98%  LMP 11/18/2015 Physical Exam  Constitutional: She is oriented to person, place, and time. She appears well-developed and well-nourished.  HENT:  Head: Normocephalic and atraumatic.  Eyes: Conjunctivae are normal. Pupils are equal, round, and reactive to light. Right eye exhibits no discharge. Left eye exhibits no discharge. No scleral icterus.  Neck: Normal range of motion. No JVD present. No tracheal deviation present.  Dried shingles on  right neck  Cardiovascular: Tachycardia present.   Pulmonary/Chest: Effort normal. No stridor.  Neurological: She is alert and oriented to person, place, and time. Coordination normal.  Psychiatric: She has a normal mood and affect. Her behavior is normal. Judgment and thought content normal.  Nursing note and vitals reviewed.   ED Course  Procedures  DIAGNOSTIC STUDIES:  Oxygen Saturation is 98% on RA, normal by my interpretation.    COORDINATION OF CARE:  9:04 AM Discussed treatment plan with pt at bedside and pt agreed to plan.  Labs Review Labs Reviewed  CBC - Abnormal; Notable for the following:    RBC 3.58 (*)    Hemoglobin 10.9 (*)    HCT 32.9 (*)    RDW 15.8 (*)    All other components within normal limits  COMPREHENSIVE METABOLIC PANEL - Abnormal; Notable for the following:    Glucose, Bld 139 (*)    Calcium 8.6 (*)    All other components within normal limits  PROTIME-INR  TROPONIN I  URINALYSIS, ROUTINE W REFLEX MICROSCOPIC (NOT AT [facility])  PREGNANCY, URINE  LIPASE, BLOOD  TROPONIN I  TROPONIN I    Imaging Review Ct Angio Chest W/cm &/or Wo Cm  12/02/2015  CLINICAL DATA:  Chest pain started last night. EXAM: CT ANGIOGRAPHY CHEST WITH CONTRAST TECHNIQUE: Multidetector CT imaging of the chest was performed using the standard protocol during bolus administration of intravenous contrast. Multiplanar CT image reconstructions and MIPs were obtained to evaluate the vascular anatomy. CONTRAST:  100 cc Isovue 370 COMPARISON:  None. FINDINGS: Mediastinum / Lymph Nodes: There is no axillary lymphadenopathy. No mediastinal lymphadenopathy. There is no hilar lymphadenopathy. Heart is mildly enlarged. No pericardial effusion. The esophagus has normal imaging features. No filling defects within the opacified pulmonary arteries to suggest the presence of an acute pulmonary embolus. No thoracic aortic aneurysm. No dissection of the thoracic aorta. Lungs / Pleura: Lungs are clear  without focal airspace consolidation. No pulmonary edema or pleural effusion. Upper Abdomen:  Unremarkable. MSK / Soft Tissues: Bone windows reveal no worrisome lytic or sclerotic osseous lesions. Review of the MIP images confirms the above findings. IMPRESSION: 1. No CT evidence for acute pulmonary embolus. 2. No findings to explain the patient's history of chest pain. Electronically Signed   By: Misty Stanley M.D.   On: 12/02/2015 10:08   US Abdomen Limited Ruq  12/02/2015  CLINICAL DATA:  Onset of right upper quadrant pain last night ; history of reflux, anemia. EXAM: US ABDOMEN LIMITED - RIGHT UPPER QUADRANT COMPARISON:  None in PACs FINDINGS: Gallbladder: The gallbladder is adequately distended. There are multiple echogenic mobile shadowing stones. There is no gallbladder wall thickening, pericholecystic fluid, or positive sonographic Murphy's sign. Common bile duct: Diameter: 5.5 mm.  No intraluminal stones are observed. Liver: Increased hepatic echotexture  diffusely. No focal mass or ductal dilation. The surface contour of the liver is normal. IMPRESSION: 1. Gallstones without sonographic evidence of acute cholecystitis. 2. Fatty infiltrative change of the liver. Electronically Signed   By: David  Martinique M.D.   On: 12/02/2015 12:07   I have personally reviewed and evaluated these images and lab results as part of my medical decision-making.   EKG Interpretation   Date/Time:  Thursday December 02 2015 09:01:48 EDT Ventricular Rate:  103 PR Interval:    QRS Duration: 86 QT Interval:  343 QTC Calculation: 449 R Axis:   -13 Text Interpretation:  Sinus tachycardia Probable left atrial enlargement  Low voltage, precordial leads Baseline wander in lead(s) III V1 Confirmed  by Carleah Yablonski MD, Ashleen Demma ([id number]) on 12/02/2015 9:07:28 AM      MDM  Pt's abdominal/chest pain has improved.  The pt will be given lortab and the number to the surgeon.  Pt also c/o right eye pain that she's had since she was dx'd  with shingles.  Pt said she was told that the zoster was in her eye, but was not given any meds or referral to optho.  I will give her a rx for viroptic and give her the number to optho.  She knows to return if worse for either issue.  Final diagnoses:  Pain  Calculus of gallbladder without cholecystitis without obstruction  HSV (herpes simplex virus) infection    I personally performed the services described in this documentation, which was scribed in my presence. The recorded information has been reviewed and is accurate.    Isla Pence, MD 12/02/15 1228

## 2015-12-27 ENCOUNTER — Telehealth: Payer: Self-pay

## 2015-12-27 NOTE — Telephone Encounter (Signed)
-----   Message from Algernon Huxley, RN sent at 10/07/2015  2:30 PM EDT ----- Regarding: Labs Pt needs labs, orders in epic

## 2015-12-27 NOTE — Telephone Encounter (Signed)
Pt aware.

## 2016-01-21 ENCOUNTER — Encounter: Payer: Self-pay | Admitting: *Deleted

## 2016-02-09 ENCOUNTER — Encounter: Payer: Self-pay | Admitting: Internal Medicine

## 2016-02-09 ENCOUNTER — Other Ambulatory Visit (INDEPENDENT_AMBULATORY_CARE_PROVIDER_SITE_OTHER): Payer: Managed Care, Other (non HMO)

## 2016-02-09 ENCOUNTER — Ambulatory Visit (INDEPENDENT_AMBULATORY_CARE_PROVIDER_SITE_OTHER): Payer: Managed Care, Other (non HMO) | Admitting: Internal Medicine

## 2016-02-09 VITALS — BP 148/96 | HR 68 | Ht 64.0 in | Wt 234.8 lb

## 2016-02-09 DIAGNOSIS — K8020 Calculus of gallbladder without cholecystitis without obstruction: Secondary | ICD-10-CM | POA: Diagnosis not present

## 2016-02-09 DIAGNOSIS — R1011 Right upper quadrant pain: Secondary | ICD-10-CM

## 2016-02-09 DIAGNOSIS — R1013 Epigastric pain: Secondary | ICD-10-CM

## 2016-02-09 DIAGNOSIS — D509 Iron deficiency anemia, unspecified: Secondary | ICD-10-CM

## 2016-02-09 LAB — CBC WITH DIFFERENTIAL/PLATELET
BASOS PCT: 0.5 % (ref 0.0–3.0)
Basophils Absolute: 0 10*3/uL (ref 0.0–0.1)
EOS ABS: 0 10*3/uL (ref 0.0–0.7)
EOS PCT: 0.8 % (ref 0.0–5.0)
HCT: 31.5 % — ABNORMAL LOW (ref 36.0–46.0)
HEMOGLOBIN: 10.9 g/dL — AB (ref 12.0–15.0)
LYMPHS ABS: 1.4 10*3/uL (ref 0.7–4.0)
Lymphocytes Relative: 26.3 % (ref 12.0–46.0)
MCHC: 34.7 g/dL (ref 30.0–36.0)
MCV: 88.7 fl (ref 78.0–100.0)
MONO ABS: 0.3 10*3/uL (ref 0.1–1.0)
Monocytes Relative: 5.2 % (ref 3.0–12.0)
NEUTROS PCT: 67.2 % (ref 43.0–77.0)
Neutro Abs: 3.7 10*3/uL (ref 1.4–7.7)
PLATELETS: 245 10*3/uL (ref 150.0–400.0)
RBC: 3.56 Mil/uL — ABNORMAL LOW (ref 3.87–5.11)
RDW: 16.8 % — AB (ref 11.5–15.5)
WBC: 5.4 10*3/uL (ref 4.0–10.5)

## 2016-02-09 LAB — COMPREHENSIVE METABOLIC PANEL
ALBUMIN: 4.1 g/dL (ref 3.5–5.2)
ALT: 13 U/L (ref 0–35)
AST: 11 U/L (ref 0–37)
Alkaline Phosphatase: 119 U/L — ABNORMAL HIGH (ref 39–117)
BUN: 8 mg/dL (ref 6–23)
CHLORIDE: 104 meq/L (ref 96–112)
CO2: 29 meq/L (ref 19–32)
CREATININE: 0.67 mg/dL (ref 0.40–1.20)
Calcium: 9.2 mg/dL (ref 8.4–10.5)
GFR: 103.42 mL/min (ref >60.00)
GLUCOSE: 161 mg/dL — AB (ref 70–99)
POTASSIUM: 3.7 meq/L (ref 3.5–5.1)
SODIUM: 138 meq/L (ref 135–145)
Total Bilirubin: 0.3 mg/dL (ref 0.2–1.2)
Total Protein: 7.2 g/dL (ref 6.0–8.3)

## 2016-02-09 LAB — LIPASE: LIPASE: 25 U/L (ref 11.0–59.0)

## 2016-02-09 LAB — IBC PANEL
Iron: 47 ug/dL (ref 42–145)
SATURATION RATIOS: 9.3 % — AB (ref 20.0–50.0)
Transferrin: 361 mg/dL — ABNORMAL HIGH (ref 212.0–360.0)

## 2016-02-09 MED ORDER — TRAMADOL HCL 50 MG PO TABS: 50.0000 mg | ORAL_TABLET | Freq: Three times a day (TID) | ORAL | 0 refills | Status: DC | PRN

## 2016-02-09 NOTE — Patient Instructions (Addendum)
If you are age 40 or older, your body mass index should be between 23-30. Your Body mass index is 40.3 kg/m. If this is out of the aforementioned range listed, please consider follow up with your Primary Care Provider.  If you are age 61 or younger, your body mass index should be between 19-25. Your Body mass index is 40.3 kg/m. If this is out of the aformentioned range listed, please consider follow up with your Primary Care Provider.   We have sent the following medications to your pharmacy for you to pick up at your convenience: Tramadol 50mg . Take 1 tab every 8 hours as needed.  You have been scheduled for an appointment with Dr Ninfa Linden at [facility]. Your appointment is on 02/10/16 at 2:50. Please arrive at 2:20pm for registration. Make certain to bring a list of current medications, including any over the counter medications or vitamins. Also bring your co-pay if you have one as well as your insurance cards. [facility] is located at [address], [address]. Should you need to reschedule your appointment, please contact them at [phone].  Your physician has requested that you go to the basement for lab work before leaving today:  Please follow up with Dr Hilarie Fredrickson in 4 months.

## 2016-02-10 ENCOUNTER — Other Ambulatory Visit: Payer: Self-pay | Admitting: Surgery

## 2016-02-10 LAB — FERRITIN: FERRITIN: 9 ng/mL — AB (ref 10.0–291.0)

## 2016-02-10 NOTE — Progress Notes (Signed)
Subjective:    Patient ID: Leslie Robinson, female    DOB: August 06, 1975, 40 y.o.   MRN: [id number]  HPI Leslie Robinson is a 40 year old female with a history of IDA, GERD with esophagitis, erosive gastritis, hiatal hernia, colonic diverticulosis, who is seen to evaluate epigastric and right upper quadrant abdominal pain. She is known to me from evaluation of iron deficiency anemia with heme-positive stool. Upper endoscopy and colonoscopy with findings mentioned above were performed in January 2017 followed by video capsule endoscopy in May 2015.  She reports that several months ago she developed epigastric and right upper quadrant tenderness tightness and pressure. At times she even felt chest pressure. This is associated with nausea but not vomiting. Was worse with eating. Is worse at night made it hard to sleep.  She denies heartburn, dysphagia and odynophagia. Her pain as mentioned is episodic. Her bowel movements a been somewhat erratic with hard stools and at other times loose stools. She has had a hard time taking oral iron recently due to her upper GI complaints. She has continued pantoprazole 40 mg daily.  She had an ultrasound performed on 12/02/2015 in the emergency department which showed gallstones without evidence of acute cholecystitis and fatty infiltrative change of the liver.  She denies fever and chills though has been struggling with fatigue.   Review of Systems As per history of present illness, otherwise negative  Current Medications, Allergies, Past Medical History, Past Surgical History, Family History and Social History were reviewed in [employer] record.     Objective:   Physical Exam BP (!) 148/96 (BP Location: Left Arm, Patient Position: Sitting, Cuff Size: Normal)   Pulse 68   Ht 5\' 4"  (1.626 m)   Wt 234 lb 12.8 oz (106.5 kg)   LMP 01/09/2016 (Approximate)   BMI 40.30 kg/m  Constitutional: Well-developed and well-nourished. No  distress. HEENT: Normocephalic and atraumatic. Oropharynx is clear and moist. No oropharyngeal exudate. Conjunctivae are normal.  No scleral icterus. Neck: Neck supple. Trachea midline. Cardiovascular: Normal rate, regular rhythm and intact distal pulses. No M/R/G Pulmonary/chest: Effort normal and breath sounds normal. No wheezing, rales or rhonchi. Abdominal: Soft, obese, epigastric and right upper quadrant tenderness without rebound or guarding, nondistended. Bowel sounds active throughout.  Extremities: no clubbing, cyanosis, or edema Neurological: Alert and oriented to person place and time. Skin: Skin is warm and dry. No rashes noted. Psychiatric: Normal mood and affect. Behavior is normal.  CBC    Component Value Date/Time   WBC 5.4 02/09/2016 1631   RBC 3.56 (L) 02/09/2016 1631   HGB 10.9 (L) 02/09/2016 1631   HCT 31.5 (L) 02/09/2016 1631   PLT 245.0 02/09/2016 1631   MCV 88.7 02/09/2016 1631   MCH 30.4 12/02/2015 0915   MCHC 34.7 02/09/2016 1631   RDW 16.8 (H) 02/09/2016 1631   LYMPHSABS 1.4 02/09/2016 1631   MONOABS 0.3 02/09/2016 1631   EOSABS 0.0 02/09/2016 1631   BASOSABS 0.0 02/09/2016 1631   CMP     Component Value Date/Time   NA 138 02/09/2016 1631   K 3.7 02/09/2016 1631   CL 104 02/09/2016 1631   CO2 29 02/09/2016 1631   GLUCOSE 161 (H) 02/09/2016 1631   BUN 8 02/09/2016 1631   CREATININE 0.67 02/09/2016 1631   CALCIUM 9.2 02/09/2016 1631   PROT 7.2 02/09/2016 1631   ALBUMIN 4.1 02/09/2016 1631   AST 11 02/09/2016 1631   ALT 13 02/09/2016 1631   ALKPHOS 119 (H) 02/09/2016  1631   BILITOT 0.3 02/09/2016 1631   GFRNONAA >60 12/02/2015 0915   GFRAA >60 12/02/2015 0915   Lipase     Component Value Date/Time   LIPASE 25.0 02/09/2016 1631    Abd [place] July 2017 -- reviewed    Assessment & Plan:  40 year old female with a history of IDA, GERD with esophagitis, erosive gastritis, hiatal hernia, colonic diverticulosis, who is seen to evaluate epigastric  and right upper quadrant abdominal pain.  1. Epigastric pain, right upper quadrant pain, nausea/gallstones -- patient has had recent endoscopic evaluation and had mild gastritis but has been maintained on PPI. Her episodic pain is likely related to symptomatic cholelithiasis. Repeat labs today reveal slightly elevated alkaline phosphatase but otherwise normal liver enzymes. I recommended referral to [facility] for evaluation of cholecystectomy. I will provide tramadol to be used 50 mg every 6 hours as needed for severe pain.  2. IDA -- iron deficiency anemia persists she's had a hard time taking oral iron. I will recommend IV iron supplementation. Monitor hemoglobin and iron studies going forward  25 minutes spent with the patient today. Greater than 50% was spent in counseling and coordination of care with the patient

## 2016-02-11 ENCOUNTER — Encounter (HOSPITAL_COMMUNITY): Payer: Self-pay | Admitting: *Deleted

## 2016-02-13 NOTE — Anesthesia Preprocedure Evaluation (Addendum)
Anesthesia Evaluation  Patient identified by MRN, date of birth, ID band Patient awake    Reviewed: Allergy & Precautions, NPO status , Patient's Chart, lab work & pertinent test results  History of Anesthesia Complications Negative for: history of anesthetic complications  Airway Mallampati: III  TM Distance: >3 FB Neck ROM: Full    Dental  (+) Teeth Intact, Dental Advisory Given   Pulmonary neg shortness of breath, asthma (does not use inhalers) , sleep apnea , neg recent URI,    Pulmonary exam normal breath sounds clear to auscultation       Cardiovascular (-) hypertension(-) angina+ DOE  (-) Past MI and (-) Cardiac Stents (-) dysrhythmias + Valvular Problems/Murmurs  Rhythm:Regular Rate:Normal     Neuro/Psych neg Seizures PSYCHIATRIC DISORDERS Anxiety RLS    GI/Hepatic hiatal hernia, PUD, GERD  Medicated and Controlled,Fatty liver Symptomatic cholelithiasis   Endo/Other  neg diabetesMorbid obesity  Renal/GU negative Renal ROS     Musculoskeletal   Abdominal (+) + obese,   Peds  Hematology  (+) Blood dyscrasia (iron deficiency), anemia ,   Anesthesia Other Findings   Reproductive/Obstetrics                            Anesthesia Physical Anesthesia Plan  ASA: III  Anesthesia Plan: General   Post-op Pain Management:    Induction: Intravenous  Airway Management Planned: Oral ETT  Additional Equipment:   Intra-op Plan:   Post-operative Plan: Extubation in OR  Informed Consent: I have reviewed the patients History and Physical, chart, labs and discussed the procedure including the risks, benefits and alternatives for the proposed anesthesia with the patient or authorized representative who has indicated his/her understanding and acceptance.   Dental advisory given  Plan Discussed with: CRNA  Anesthesia Plan Comments: (Risks of general anesthesia discussed including, but not  limited to, sore throat, hoarse voice, chipped/damaged teeth, injury to vocal cords, nausea and vomiting, allergic reactions, lung infection, heart attack, stroke, and death. All questions answered. )       Anesthesia Quick Evaluation

## 2016-02-14 ENCOUNTER — Ambulatory Visit (HOSPITAL_COMMUNITY): Payer: Managed Care, Other (non HMO) | Admitting: Anesthesiology

## 2016-02-14 ENCOUNTER — Inpatient Hospital Stay (HOSPITAL_COMMUNITY)
Admission: AD | Admit: 2016-02-14 | Discharge: 2016-02-15 | DRG: 988 | Disposition: A | Discharge status: Home / Self Care | Payer: Managed Care, Other (non HMO) | Source: Ambulatory Visit | Attending: Surgery | Admitting: Surgery

## 2016-02-14 ENCOUNTER — Ambulatory Visit (HOSPITAL_COMMUNITY): Payer: Managed Care, Other (non HMO)

## 2016-02-14 ENCOUNTER — Encounter (HOSPITAL_COMMUNITY)
Admission: AD | Disposition: A | Discharge status: Home / Self Care | Payer: Self-pay | Source: Ambulatory Visit | Attending: Surgery

## 2016-02-14 DIAGNOSIS — J811 Chronic pulmonary edema: Secondary | ICD-10-CM

## 2016-02-14 DIAGNOSIS — K219 Gastro-esophageal reflux disease without esophagitis: Secondary | ICD-10-CM | POA: Diagnosis present

## 2016-02-14 DIAGNOSIS — R011 Cardiac murmur, unspecified: Secondary | ICD-10-CM | POA: Diagnosis present

## 2016-02-14 DIAGNOSIS — Z6841 Body Mass Index (BMI) 40.0 and over, adult: Secondary | ICD-10-CM

## 2016-02-14 DIAGNOSIS — J810 Acute pulmonary edema: Secondary | ICD-10-CM | POA: Diagnosis present

## 2016-02-14 DIAGNOSIS — Z09 Encounter for follow-up examination after completed treatment for conditions other than malignant neoplasm: Secondary | ICD-10-CM

## 2016-02-14 DIAGNOSIS — E119 Type 2 diabetes mellitus without complications: Secondary | ICD-10-CM | POA: Diagnosis present

## 2016-02-14 DIAGNOSIS — J45909 Unspecified asthma, uncomplicated: Secondary | ICD-10-CM | POA: Diagnosis present

## 2016-02-14 DIAGNOSIS — K5790 Diverticulosis of intestine, part unspecified, without perforation or abscess without bleeding: Secondary | ICD-10-CM | POA: Diagnosis present

## 2016-02-14 DIAGNOSIS — F419 Anxiety disorder, unspecified: Secondary | ICD-10-CM | POA: Diagnosis present

## 2016-02-14 DIAGNOSIS — K8010 Calculus of gallbladder with chronic cholecystitis without obstruction: Secondary | ICD-10-CM | POA: Diagnosis present

## 2016-02-14 HISTORY — DX: Cardiac murmur, unspecified: R01.1

## 2016-02-14 HISTORY — DX: Urinary tract infection, site not specified: N39.0

## 2016-02-14 HISTORY — DX: Reserved for inherently not codable concepts without codable children: IMO0001

## 2016-02-14 HISTORY — PX: CHOLECYSTECTOMY: SHX55

## 2016-02-14 HISTORY — DX: Pneumonia, unspecified organism: J18.9

## 2016-02-14 HISTORY — DX: Gestational diabetes mellitus in pregnancy, unspecified control: O24.419

## 2016-02-14 HISTORY — DX: Anxiety disorder, unspecified: F41.9

## 2016-02-14 LAB — I-STAT BETA HCG BLOOD, ED (NOT ORDERABLE): I-stat hCG, quantitative: <5 m[IU]/mL (ref <5)

## 2016-02-14 LAB — HCG, SERUM, QUALITATIVE: PREG SERUM: NEGATIVE

## 2016-02-14 LAB — MRSA PCR SCREENING: MRSA BY PCR: NEGATIVE

## 2016-02-14 SURGERY — LAPAROSCOPIC CHOLECYSTECTOMY
Anesthesia: General | Site: Abdomen

## 2016-02-14 MED ORDER — ONDANSETRON HCL 4 MG PO TABS: 4.0000 mg | ORAL_TABLET | Freq: Four times a day (QID) | ORAL | Status: DC | PRN

## 2016-02-14 MED ORDER — FENTANYL CITRATE (PF) 100 MCG/2ML IJ SOLN
25.0000 ug | INTRAMUSCULAR | Status: DC | PRN
  Administered 2016-02-14: 25 ug via INTRAVENOUS
  Administered 2016-02-14: 50 ug via INTRAVENOUS

## 2016-02-14 MED ORDER — SODIUM CHLORIDE 0.9% FLUSH: 3.0000 mL | Freq: Two times a day (BID) | INTRAVENOUS | Status: DC

## 2016-02-14 MED ORDER — LIDOCAINE HCL (CARDIAC) 20 MG/ML IV SOLN
INTRAVENOUS | Status: DC | PRN
  Administered 2016-02-14: 100 mg via INTRAVENOUS

## 2016-02-14 MED ORDER — SODIUM CHLORIDE 0.9 % IV SOLN: 250.0000 mL | INTRAVENOUS | Status: DC | PRN

## 2016-02-14 MED ORDER — PROPOFOL 10 MG/ML IV BOLUS
INTRAVENOUS | Status: AC
  Filled 2016-02-14: qty 40

## 2016-02-14 MED ORDER — ONDANSETRON HCL 4 MG/2ML IJ SOLN
INTRAMUSCULAR | Status: DC | PRN
  Administered 2016-02-14: 4 mg via INTRAVENOUS

## 2016-02-14 MED ORDER — FENTANYL CITRATE (PF) 100 MCG/2ML IJ SOLN
INTRAMUSCULAR | Status: AC
  Administered 2016-02-14: 50 ug via INTRAVENOUS
  Filled 2016-02-14: qty 2

## 2016-02-14 MED ORDER — HYDROCODONE-ACETAMINOPHEN 5-325 MG PO TABS
1.0000 | ORAL_TABLET | ORAL | Status: DC | PRN
  Administered 2016-02-14 – 2016-02-15 (×3): 2 via ORAL
  Filled 2016-02-14 (×3): qty 2

## 2016-02-14 MED ORDER — PROMETHAZINE HCL 25 MG/ML IJ SOLN: 6.2500 mg | INTRAMUSCULAR | Status: DC | PRN

## 2016-02-14 MED ORDER — SUGAMMADEX SODIUM 200 MG/2ML IV SOLN
INTRAVENOUS | Status: DC | PRN
Start: 2016-02-14 — End: 2016-02-14
  Administered 2016-02-14: 212.2 mg via INTRAVENOUS

## 2016-02-14 MED ORDER — ONDANSETRON HCL 4 MG/2ML IJ SOLN
INTRAMUSCULAR | Status: AC
Start: 2016-02-14 — End: 2016-02-14
  Filled 2016-02-14: qty 2

## 2016-02-14 MED ORDER — MORPHINE SULFATE (PF) 2 MG/ML IV SOLN
1.0000 mg | INTRAVENOUS | Status: DC | PRN
  Administered 2016-02-14: 2 mg via INTRAVENOUS
  Administered 2016-02-14 (×3): 4 mg via INTRAVENOUS
  Administered 2016-02-14: 2 mg via INTRAVENOUS
  Administered 2016-02-15: 4 mg via INTRAVENOUS
  Filled 2016-02-14 (×2): qty 1
  Filled 2016-02-14 (×4): qty 2

## 2016-02-14 MED ORDER — KETOROLAC TROMETHAMINE 30 MG/ML IJ SOLN
INTRAMUSCULAR | Status: AC
  Filled 2016-02-14: qty 1

## 2016-02-14 MED ORDER — MIDAZOLAM HCL 2 MG/2ML IJ SOLN
INTRAMUSCULAR | Status: AC
  Filled 2016-02-14: qty 2

## 2016-02-14 MED ORDER — BUPIVACAINE-EPINEPHRINE 0.25% -1:200000 IJ SOLN
INTRAMUSCULAR | Status: DC | PRN
  Administered 2016-02-14: 20 mL

## 2016-02-14 MED ORDER — LACTATED RINGERS IV SOLN
INTRAVENOUS | Status: DC | PRN
  Administered 2016-02-14: 07:00:00 via INTRAVENOUS

## 2016-02-14 MED ORDER — MIDAZOLAM HCL 5 MG/5ML IJ SOLN
INTRAMUSCULAR | Status: DC | PRN
  Administered 2016-02-14: 2 mg via INTRAVENOUS

## 2016-02-14 MED ORDER — OXYCODONE HCL 5 MG PO TABS
5.0000 mg | ORAL_TABLET | ORAL | Status: DC | PRN
  Administered 2016-02-14 (×2): 10 mg via ORAL
  Administered 2016-02-14: 5 mg via ORAL
  Administered 2016-02-15: 10 mg via ORAL
  Filled 2016-02-14 (×3): qty 2
  Filled 2016-02-14: qty 1
  Filled 2016-02-14: qty 2

## 2016-02-14 MED ORDER — FENTANYL CITRATE (PF) 100 MCG/2ML IJ SOLN
INTRAMUSCULAR | Status: AC
  Filled 2016-02-14: qty 4

## 2016-02-14 MED ORDER — FENTANYL CITRATE (PF) 100 MCG/2ML IJ SOLN
INTRAMUSCULAR | Status: DC | PRN
  Administered 2016-02-14: 100 ug via INTRAVENOUS

## 2016-02-14 MED ORDER — SUGAMMADEX SODIUM 200 MG/2ML IV SOLN
INTRAVENOUS | Status: AC
Start: 2016-02-14 — End: 2016-02-14
  Filled 2016-02-14: qty 2

## 2016-02-14 MED ORDER — PROPOFOL 10 MG/ML IV BOLUS
INTRAVENOUS | Status: DC | PRN
  Administered 2016-02-14: 200 mg via INTRAVENOUS
  Administered 2016-02-14: 50 mg via INTRAVENOUS

## 2016-02-14 MED ORDER — KETOROLAC TROMETHAMINE 30 MG/ML IJ SOLN
INTRAMUSCULAR | Status: DC | PRN
  Administered 2016-02-14: 30 mg via INTRAVENOUS

## 2016-02-14 MED ORDER — CEFAZOLIN SODIUM-DEXTROSE 2-4 GM/100ML-% IV SOLN
2.0000 g | INTRAVENOUS | Status: AC
  Administered 2016-02-14: 2 g via INTRAVENOUS
  Filled 2016-02-14: qty 100

## 2016-02-14 MED ORDER — ACETAMINOPHEN 325 MG PO TABS: 650.0000 mg | ORAL_TABLET | ORAL | Status: DC | PRN

## 2016-02-14 MED ORDER — SODIUM CHLORIDE 0.9 % IR SOLN
Status: DC | PRN
  Administered 2016-02-14: 1000 mL

## 2016-02-14 MED ORDER — ROCURONIUM BROMIDE 100 MG/10ML IV SOLN
INTRAVENOUS | Status: DC | PRN
  Administered 2016-02-14: 50 mg via INTRAVENOUS

## 2016-02-14 MED ORDER — ACETAMINOPHEN 650 MG RE SUPP: 650.0000 mg | RECTAL | Status: DC | PRN

## 2016-02-14 MED ORDER — BUPIVACAINE HCL (PF) 0.25 % IJ SOLN
INTRAMUSCULAR | Status: AC
  Filled 2016-02-14: qty 30

## 2016-02-14 MED ORDER — ROCURONIUM BROMIDE 10 MG/ML (PF) SYRINGE
PREFILLED_SYRINGE | INTRAVENOUS | Status: AC
  Filled 2016-02-14: qty 10

## 2016-02-14 MED ORDER — 0.9 % SODIUM CHLORIDE (POUR BTL) OPTIME
TOPICAL | Status: DC | PRN
  Administered 2016-02-14: 1000 mL

## 2016-02-14 MED ORDER — LIDOCAINE 2% (20 MG/ML) 5 ML SYRINGE
INTRAMUSCULAR | Status: AC
  Filled 2016-02-14: qty 5

## 2016-02-14 MED ORDER — SODIUM CHLORIDE 0.9% FLUSH: 3.0000 mL | INTRAVENOUS | Status: DC | PRN

## 2016-02-14 MED ORDER — HYDROCODONE-ACETAMINOPHEN 5-325 MG PO TABS: 1.0000 | ORAL_TABLET | ORAL | 0 refills | Status: DC | PRN

## 2016-02-14 MED ORDER — ONDANSETRON HCL 4 MG/2ML IJ SOLN
4.0000 mg | Freq: Four times a day (QID) | INTRAMUSCULAR | Status: DC | PRN
  Administered 2016-02-14: 4 mg via INTRAVENOUS
  Filled 2016-02-14: qty 2

## 2016-02-14 SURGICAL SUPPLY — 29 items
APPLIER CLIP 5 13 M/L LIGAMAX5 (MISCELLANEOUS) ×3
CANISTER SUCTION 2500CC (MISCELLANEOUS) ×3 IMPLANT
CHLORAPREP W/TINT 26ML (MISCELLANEOUS) ×3 IMPLANT
CLIP APPLIE 5 13 M/L LIGAMAX5 (MISCELLANEOUS) ×1 IMPLANT
COVER SURGICAL LIGHT HANDLE (MISCELLANEOUS) ×3 IMPLANT
ELECT REM PT RETURN 9FT ADLT (ELECTROSURGICAL) ×3
ELECTRODE REM PT RTRN 9FT ADLT (ELECTROSURGICAL) ×1 IMPLANT
GLOVE SURG SIGNA 7.5 PF LTX (GLOVE) ×3 IMPLANT
GOWN STRL REUS W/ TWL LRG LVL3 (GOWN DISPOSABLE) ×2 IMPLANT
GOWN STRL REUS W/ TWL XL LVL3 (GOWN DISPOSABLE) ×1 IMPLANT
GOWN STRL REUS W/TWL LRG LVL3 (GOWN DISPOSABLE) ×4
GOWN STRL REUS W/TWL XL LVL3 (GOWN DISPOSABLE) ×2
KIT BASIN OR (CUSTOM PROCEDURE TRAY) ×3 IMPLANT
KIT ROOM TURNOVER OR (KITS) ×3 IMPLANT
LIQUID BAND (GAUZE/BANDAGES/DRESSINGS) ×3 IMPLANT
NS IRRIG 1000ML POUR BTL (IV SOLUTION) ×3 IMPLANT
PAD ARMBOARD 7.5X6 YLW CONV (MISCELLANEOUS) ×3 IMPLANT
POUCH SPECIMEN RETRIEVAL 10MM (ENDOMECHANICALS) ×3 IMPLANT
SCISSORS LAP 5X35 DISP (ENDOMECHANICALS) ×3 IMPLANT
SET IRRIG TUBING LAPAROSCOPIC (IRRIGATION / IRRIGATOR) ×3 IMPLANT
SLEEVE ENDOPATH XCEL 5M (ENDOMECHANICALS) ×6 IMPLANT
SPECIMEN JAR SMALL (MISCELLANEOUS) ×3 IMPLANT
SUT MON AB 4-0 PC3 18 (SUTURE) ×3 IMPLANT
TOWEL OR 17X24 6PK STRL BLUE (TOWEL DISPOSABLE) ×3 IMPLANT
TOWEL OR 17X26 10 PK STRL BLUE (TOWEL DISPOSABLE) ×3 IMPLANT
TRAY LAPAROSCOPIC MC (CUSTOM PROCEDURE TRAY) ×3 IMPLANT
TROCAR XCEL BLUNT TIP 100MML (ENDOMECHANICALS) ×3 IMPLANT
TROCAR XCEL NON-BLD 5MMX100MML (ENDOMECHANICALS) ×3 IMPLANT
TUBING INSUFFLATION (TUBING) ×3 IMPLANT

## 2016-02-14 NOTE — Interval H&P Note (Signed)
History and Physical Interval Note: no change in H and P  02/14/2016 6:30 AM  Leslie Robinson  has presented today for surgery, with the diagnosis of SYMPTOMATIC CHOLELITHIASIS  The various methods of treatment have been discussed with the patient and family. After consideration of risks, benefits and other options for treatment, the patient has consented to  Procedure(s): LAPAROSCOPIC CHOLECYSTECTOMY (N/A) as a surgical intervention .  The patient's history has been reviewed, patient examined, no change in status, stable for surgery.  I have reviewed the patient's chart and labs.  Questions were answered to the patient's satisfaction.     Beniah Magnan A

## 2016-02-14 NOTE — Progress Notes (Signed)
Patient ID: Leslie Robinson, female   DOB: 1976/05/21, 40 y.o.   MRN: [id number]   Events noted Doing much better Just a little cough  Sat's 97% on RA Lungs with crackles  Keep in step down  Hopefully home tomorrow

## 2016-02-14 NOTE — Discharge Instructions (Signed)
[facility] ______[facility] LAPAROSCOPIC SURGERY: POST OP INSTRUCTIONS Always review your discharge instruction sheet given to you by the facility where your surgery was performed. IF YOU HAVE DISABILITY OR FAMILY LEAVE FORMS, YOU MUST BRING THEM TO THE OFFICE FOR PROCESSING.   DO NOT GIVE THEM TO YOUR DOCTOR.  1. A prescription for pain medication may be given to you upon discharge.  Take your pain medication as prescribed, if needed.  If narcotic pain medicine is not needed, then you may take acetaminophen (Tylenol) or ibuprofen (Advil) as needed. 2. Take your usually prescribed medications unless otherwise directed. 3. If you need a refill on your pain medication, please contact your pharmacy.  They will contact our office to request authorization. Prescriptions will not be filled after 5pm or on week-ends. 4. You should follow a light diet the first few days after arrival home, such as soup and crackers, etc.  Be sure to include lots of fluids daily. 5. Most patients will experience some swelling and bruising in the area of the incisions.  Ice packs will help.  Swelling and bruising can take several days to resolve.  6. It is common to experience some constipation if taking pain medication after surgery.  Increasing fluid intake and taking a stool softener (such as Colace) will usually help or prevent this problem from occurring.  A mild laxative (Milk of Magnesia or Miralax) should be taken according to package instructions if there are no bowel movements after 48 hours. 7. Unless discharge instructions indicate otherwise, you may remove your bandages 24-48 hours after surgery, and you may shower at that time.  You may have steri-strips (small skin tapes) in place directly over the incision.  These strips should be left on the skin for 7-10 days.  If your surgeon used skin glue on the incision, you may shower in 24 hours.  The glue will flake off over the next 2-3 weeks.  Any sutures or  staples will be removed at the office during your follow-up visit. 8. ACTIVITIES:  You may resume regular (light) daily activities beginning the next day--such as daily self-care, walking, climbing stairs--gradually increasing activities as tolerated.  You may have sexual intercourse when it is comfortable.  Refrain from any heavy lifting or straining until approved by your doctor. a. You may drive when you are no longer taking prescription pain medication, you can comfortably wear a seatbelt, and you can safely maneuver your car and apply brakes. b. RETURN TO WORK:  __________________________________________________________ 9. You should see your doctor in the office for a follow-up appointment approximately 2-3 weeks after your surgery.  Make sure that you call for this appointment within a day or two after you arrive home to insure a convenient appointment time. 10. OTHER INSTRUCTIONS:OK TO SHOWER TOMORROW 11. NO LIFTING MORE THAN 15 POUNDS FOR 2 TO 3 WEEKS 12. ICE PACK AND IBUPROFEN ALSO FOR PAIN __________________________________________________________________________________________________________________________ __________________________________________________________________________________________________________________________ WHEN TO CALL YOUR DOCTOR: 1. Fever over 101.0 2. Inability to urinate 3. Continued bleeding from incision. 4. Increased pain, redness, or drainage from the incision. 5. Increasing abdominal pain  The clinic staff is available to answer your questions during regular business hours.  Please dont hesitate to call and ask to speak to one of the nurses for clinical concerns.  If you have a medical emergency, go to the nearest emergency room or call 911.  A surgeon from [facility] is always on call at the hospital. [address] [place],  [place]  [address] ? [address], [place], [place]   [address] [phone] ? [phone] ? FAX  [phone] Web site: [email or web address]

## 2016-02-14 NOTE — Anesthesia Procedure Notes (Addendum)
Procedure Name: Intubation Date/Time: 02/14/2016 7:38 AM Performed by: Scheryl Darter Pre-anesthesia Checklist: Patient identified, Emergency Drugs available, Suction available and Patient being monitored Patient Re-evaluated:Patient Re-evaluated prior to inductionOxygen Delivery Method: Circle System Utilized Preoxygenation: Pre-oxygenation with 100% oxygen Intubation Type: IV induction Ventilation: Mask ventilation without difficulty Laryngoscope Size: Miller and 2 Tube type: Oral Tube size: 7.5 mm Number of attempts: 1 Airway Equipment and Method: Stylet and Oral airway Placement Confirmation: ETT inserted through vocal cords under direct vision,  positive ETCO2 and breath sounds checked- equal and bilateral Secured at: 22 cm Tube secured with: Tape Dental Injury: Teeth and Oropharynx as per pre-operative assessment

## 2016-02-14 NOTE — Anesthesia Postprocedure Evaluation (Signed)
Anesthesia Post Note  Patient: Leslie Robinson  Procedure(s) Performed: Procedure(s) (LRB): LAPAROSCOPIC CHOLECYSTECTOMY (N/A)  Patient location during evaluation: PACU Anesthesia Type: General Level of consciousness: awake and alert Pain management: pain level controlled Vital Signs Assessment: post-procedure vital signs reviewed and stable Respiratory status: spontaneous breathing, nonlabored ventilation, respiratory function stable and patient connected to nasal cannula oxygen Cardiovascular status: blood pressure returned to baseline and stable Postop Assessment: no signs of nausea or vomiting Anesthesia complication: Patient with pink frothy fluid in ETT upon emergence after clamping down on ETT. CXR in PACU showed mild pulmonary vascular redistribution. Patient started on CPAP in PACU with improvement in secretions. Maintaining O2 saturations. Patient to be admitted. Comments: Discussed reason for the decision for admission with the patient. The patient expressed understanding and did not have any questions.    Last Vitals:  Vitals:   02/14/16 1007 02/14/16 1015  BP: 128/88 128/88  Pulse: 89   Resp: 13 (!) 23  Temp:      Last Pain:  Vitals:   02/14/16 1041  PainSc: 5                  Nilda Simmer

## 2016-02-14 NOTE — H&P (Signed)
Leslie Robinson 02/10/2016 2:42 PM Location: [facility] Patient #: [id number] DOB: 1975-06-05 Undefined / Language: Leslie Robinson / Race: White Female   History of Present Illness (Leslie Robinson A. Ninfa Linden MD; 02/10/2016 2:54 PM) Patient words: gallbladder.  The patient is a 40 year old female who presents for evaluation of gall stones. This is a very pleasant patient referred to me by Dr. Zenovia Robinson for symptomatic gallstones. She is well known to him from previous esophagitis and erosive gastritis. She most recently is having epigastric and right upper quadrant abdominal pain with nausea after fatty meals. She has had an ultrasound showing gallstones. She had normal liver function test in July. She is having attacks of discomfort almost daily and go up into the chest as well. The pain is moderate in intensity. She denies jaundice. Bowel movements are normal. She is otherwise without complaints.   Other Problems [name], RMA; 02/10/2016 2:46 PM) Anxiety Disorder Asthma Back Pain Cholelithiasis Diabetes Mellitus Diverticulosis Gastric Ulcer Gastroesophageal Reflux Disease Heart murmur  Past Surgical History [name], RMA; 02/10/2016 2:46 PM) Oral Surgery  Diagnostic Studies History [name], RMA; 02/10/2016 2:46 PM) Colonoscopy within last year Mammogram 1-3 years ago Pap Smear 1-5 years ago  Allergies [name], RMA; 02/10/2016 2:43 PM) No Known Drug Allergies09/21/2017  Medication History Shirlean Mylar Gwynn, RMA; 02/10/2016 2:44 PM) DULoxetine HCl (60MG  Capsule DR Part, Oral) Active. Mirapex (Oral) Specific dose unknown - Active. TraMADol HCl (50MG  Tablet, Oral) Active. Medications Reconciled  Social History [name], RMA; 02/10/2016 2:46 PM) Caffeine use Tea. No alcohol use No drug use Tobacco use Never smoker.  Family History [name], RMA; 02/10/2016 2:46 PM) Colon Polyps Mother. Depression Mother. Diabetes Mellitus  Mother. Heart Disease Mother. Heart disease in female family member before age 19 Hypertension Mother. Respiratory Condition Father.  Pregnancy / Birth History [name], [name]; 02/10/2016 2:46 PM) Age at menarche 75 years. Gravida 5 Maternal age 5-20 Para 3 Regular periods    Review of Systems ([name]; 02/10/2016 2:46 PM) General Present- Chills, Fatigue, Night Sweats and Weight Gain. Not Present- Appetite Loss, Fever and Weight Loss. Skin Not Present- Change in Wart/Mole, Dryness, Hives, Jaundice, New Lesions, Non-Healing Wounds, Rash and Ulcer. HEENT Present- Seasonal Allergies and Wears glasses/contact lenses. Not Present- Earache, Hearing Loss, Hoarseness, Nose Bleed, Oral Ulcers, Ringing in the Ears, Sinus Pain, Sore Throat, Visual Disturbances and Yellow Eyes. Respiratory Not Present- Bloody sputum, Chronic Cough, Difficulty Breathing, Snoring and Wheezing. Breast Not Present- Breast Mass, Breast Pain, Nipple Discharge and Skin Changes. Cardiovascular Not Present- Chest Pain, Difficulty Breathing Lying Down, Leg Cramps, Palpitations, Rapid Heart Rate, Shortness of Breath and Swelling of Extremities. Gastrointestinal Present- Abdominal Pain, Bloating, Excessive gas, Gets full quickly at meals, Indigestion and Nausea. Not Present- Bloody Stool, Change in Bowel Habits, Chronic diarrhea, Constipation, Difficulty Swallowing, Hemorrhoids, Rectal Pain and Vomiting. Female Genitourinary Present- Frequency, Nocturia and Urgency. Not Present- Painful Urination and Pelvic Pain. Musculoskeletal Present- Muscle Weakness. Not Present- Back Pain, Joint Pain, Joint Stiffness, Muscle Pain and Swelling of Extremities. Neurological Present- Weakness. Not Present- Decreased Memory, Fainting, Headaches, Numbness, Seizures, Tingling, Tremor and Trouble walking. Psychiatric Present- Anxiety. Not Present- Bipolar, Change in Sleep Pattern, Depression, Fearful and Frequent crying. Endocrine  Not Present- Cold Intolerance, Excessive Hunger, Hair Changes, Heat Intolerance, Hot flashes and New Diabetes. Hematology Not Present- Blood Thinners, Easy Bruising, Excessive bleeding, Gland problems, HIV and Persistent Infections.  Vitals (Robin Gwynn RMA; 02/10/2016 2:45 PM) 02/10/2016 2:44 PM Weight: 235.6 lb Height:  63in Body Surface Area: 2.07 m Body Mass Index: 41.73 kg/m  Temp.: 98.62F  Pulse: 114 (Regular)  BP: 140/90 (Sitting, Left Arm, Standard)       Physical Exam (Luis Nickles A. Ninfa Linden MD; 02/10/2016 2:55 PM) General Mental Status-Alert. General Appearance-Consistent with stated age. Hydration-Well hydrated. Voice-Normal.  Head and Neck Head-normocephalic, atraumatic with no lesions or palpable masses.  Eye Eyeball - Bilateral-Extraocular movements intact. Sclera/Conjunctiva - Bilateral-No scleral icterus.  Chest and Lung Exam Chest and lung exam reveals -quiet, even and easy respiratory effort with no use of accessory muscles and on auscultation, normal breath sounds, no adventitious sounds and normal vocal resonance. Inspection Chest Wall - Normal. Back - normal.  Cardiovascular Cardiovascular examination reveals -on palpation PMI is normal in location and amplitude, no palpable S3 or S4. Normal cardiac borders., normal heart sounds, regular rate and rhythm with no murmurs, carotid auscultation reveals no bruits and normal pedal pulses bilaterally.  Abdomen Inspection Inspection of the abdomen reveals - No Hernias. Skin - Scar - no surgical scars. Palpation/Percussion Palpation and Percussion of the abdomen reveal - Soft, No Rebound tenderness, No Rigidity (guarding) and No hepatosplenomegaly. Tenderness - Right Upper Quadrant. Note: There is mild tenderness in the right upper quadrant. Auscultation Auscultation of the abdomen reveals - Bowel sounds normal.  Neurologic - Did not examine.  Musculoskeletal - Did not  examine.    Assessment & Plan (Veleta Yamamoto A. 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 I am recommending laparoscopic cholecystectomy. I discussed this with her in detail and gave her a pamphlet regarding surgery. I discussed the risks which includes but is not limited to bleeding, infection, bile duct injury, bile leak, injury to other structures, the need to convert to an open procedure, cardiopulmonary issues, postoperative recovery, etc. She understands and wished to proceed. Surgery is scheduled urgently Current Plans Pt Education - Pamphlet Given - Laparoscopic Gallbladder Surgery: discussed with patient and provided information.

## 2016-02-14 NOTE — Op Note (Signed)

## 2016-02-14 NOTE — Transfer of Care (Signed)
Immediate Anesthesia Transfer of Care Note  Patient: Leslie Robinson  Procedure(s) Performed: Procedure(s): LAPAROSCOPIC CHOLECYSTECTOMY (N/A)  Patient Location: PACU  Anesthesia Type:General  Level of Consciousness: awake, alert , oriented and sedated  Airway & Oxygen Therapy: Patient Spontanous Breathing and non-rebreather face mask  Post-op Assessment: Report given to RN, Post -op Vital signs reviewed and stable and Patient moving all extremities  Post vital signs: Reviewed and stable  Last Vitals:  Vitals:   02/14/16 0607  BP: 122/69  Pulse: 77  Resp: 18    Last Pain:  Vitals:   02/14/16 0642  PainSc: 5          Complications: respiratory complications

## 2016-02-15 ENCOUNTER — Encounter (HOSPITAL_COMMUNITY): Payer: Self-pay | Admitting: Surgery

## 2016-02-15 ENCOUNTER — Inpatient Hospital Stay (HOSPITAL_COMMUNITY): Payer: Managed Care, Other (non HMO)

## 2016-02-15 MED ORDER — IBUPROFEN 200 MG PO TABS
500.0000 mg | ORAL_TABLET | Freq: Once | ORAL | Status: AC
  Administered 2016-02-15: 500 mg via ORAL
  Filled 2016-02-15: qty 3

## 2016-02-15 NOTE — Care Management Note (Signed)
Case Management Note  Patient Details  Name: Leslie Robinson MRN: [id number] Date of Birth: [birth year]  Subjective/Objective:    CM following for progression and d/c planning.                 Action/Plan: 02/15/2016 Chart reviewed , no HH or DME needs identified. Noted plan to d/c pt to home today. Pt independent or ADLs.   Expected Discharge Date:     02/15/2016             Expected Discharge Plan:  Home/Self Care  In-House Referral:  NA  Discharge planning Services  NA  Post Acute Care Choice:  NA Choice offered to:  NA  DME Arranged:   NA DME Agency:   NA  HH Arranged:   NA HH Agency:   NA  Status of Service:  Completed, signed off  If discussed at [place] of Stay Meetings, dates discussed:    Additional Comments:  Adron Bene, RN 02/15/2016, 11:53 AM

## 2016-02-15 NOTE — Progress Notes (Signed)
1 Day Post-Op  Subjective: Denies SOB this morning Just having mild productive cough Minimal pain  Objective: Vital signs in last 24 hours: Temp:  [97.4 F (36.3 C)-98.5 F (36.9 C)] 98.5 F (36.9 C) (09/26 0308) Pulse Rate:  [83-100] 100 (09/25 2353) Resp:  [10-23] 11 (09/26 0308) BP: (110-145)/(64-125) 126/70 (09/26 0520) SpO2:  [90 %-100 %] 100 % (09/26 0520) Weight:  [108 kg (238 lb 1.6 oz)-108.3 kg (238 lb 12.1 oz)] 108 kg (238 lb 1.6 oz) (09/25 1041) Last BM Date: 02/13/16  Intake/Output from previous day: 09/25 0701 - 09/26 0700 In: 240 [P.O.:240] Out: 25 [Blood:25] Intake/Output this shift: No intake/output data recorded.  Exam: Comfortable in appearance Lungs clear bilaterally  Lab Results:  No results for input(s): WBC, HGB, HCT, PLT in the last 72 hours. BMET No results for input(s): NA, K, CL, CO2, GLUCOSE, BUN, CREATININE, CALCIUM in the last 72 hours. PT/INR No results for input(s): LABPROT, INR in the last 72 hours. ABG No results for input(s): PHART, HCO3 in the last 72 hours.  Invalid input(s): PCO2, PO2  Studies/Results: Dg Chest Port 1 View  Result Date: 02/14/2016 CLINICAL DATA:  Pulmonary edema.  Postop cholecystectomy. EXAM: PORTABLE CHEST 1 VIEW COMPARISON:  12/02/2015 CT chest FINDINGS: The cardiac silhouette is top-normal in size. There is no aortic aneurysm. Mediastinal contours are unremarkable. There is mild pulmonary vascular redistribution consistent with CHF. No effusion is noted. The left costophrenic angle is excluded on this study. There is no pneumothorax. No suspicious osseous lesions are seen. IMPRESSION: Mild CHF. Electronically Signed   By: Ashley Royalty M.D.   On: 02/14/2016 09:12    Anti-infectives: Anti-infectives    Start     Dose/Rate Route Frequency Ordered Stop   02/14/16 0630  ceFAZolin (ANCEF) IVPB 2g/100 mL premix     2 g 200 mL/hr over 30 Minutes Intravenous On call to O.R. 02/14/16 [id number] 02/14/16 0743       Assessment/Plan: s/p Procedure(s): LAPAROSCOPIC CHOLECYSTECTOMY (N/A)  Post op flash pulmonary edema  Ok to discharge home today  LOS: 1 day    Chanteria Haggard A 02/15/2016

## 2016-02-15 NOTE — Discharge Summary (Signed)
Physician Discharge Summary  Patient ID: Leslie Robinson MRN: [id number] DOB/AGE: [birth year] 40 y.o.  Admit date: 02/14/2016 Discharge date: 02/15/2016  Admission Diagnoses:  Discharge Diagnoses:  Active Problems:   Pulmonary edema, acute [facility])   Discharged Condition: good  Hospital Course: admitted post op for flash pulmonary edema that occurred at wake up from anesthesia. Improved POD#1 so discharged home.  Consults: None  Significant Diagnostic Studies:   Treatments: surgery: lap chole  Discharge Exam: Blood pressure 126/70, pulse 100, temperature 98.5 F (36.9 C), temperature source Oral, resp. rate 11, height 5\' 3"  (1.6 m), weight 108 kg (238 lb 1.6 oz), last menstrual period 01/09/2016, SpO2 100 %. Awake and alert, comfortable in NAD Lungs clear bilaterally CV RRR Abdomen soft  Disposition: 01-Home or Self Care     Medication List    STOP taking these medications   traMADol 50 MG tablet Commonly known as:  ULTRAM   trifluridine 1 % ophthalmic solution Commonly known as:  VIROPTIC     TAKE these medications   DULoxetine 60 MG capsule Commonly known as:  CYMBALTA Take 60 mg by mouth daily.   ferrous sulfate 325 (65 FE) MG tablet Take 325 mg by mouth daily with breakfast. Reported on 06/09/2015   HYDROcodone-acetaminophen 5-325 MG tablet Commonly known as:  NORCO/VICODIN Take 1-2 tablets by mouth every 4 (four) hours as needed. What changed:  how much to take   ibuprofen 200 MG tablet Commonly known as:  ADVIL,MOTRIN Take 400 mg by mouth every 6 (six) hours as needed for headache or moderate pain.   ondansetron 4 MG tablet Commonly known as:  ZOFRAN Take 1 tablet (4 mg total) by mouth every 6 (six) hours. What changed:  when to take this  reasons to take this   pantoprazole 40 MG tablet Commonly known as:  PROTONIX Take 1 tablet (40 mg total) by mouth daily.   pramipexole 0.125 MG tablet Commonly known as:  MIRAPEX Take 0.125 mg by mouth  daily. Reported on 06/09/2015      Follow-up Information    Mateja Dier A, MD. Schedule an appointment as soon as possible for a visit in 3 weeks.   Specialty:  General Surgery Contact information: [address] [place] [address] [phone]           Signed: Harl Bowie 02/15/2016, 7:41 AM

## 2016-02-22 ENCOUNTER — Other Ambulatory Visit: Payer: Self-pay

## 2016-02-22 DIAGNOSIS — D509 Iron deficiency anemia, unspecified: Secondary | ICD-10-CM

## 2016-03-07 ENCOUNTER — Encounter (HOSPITAL_COMMUNITY): Payer: Self-pay

## 2016-03-07 ENCOUNTER — Encounter (HOSPITAL_COMMUNITY)
Admission: RE | Admit: 2016-03-07 | Discharge: 2016-03-07 | Disposition: A | Discharge status: Home / Self Care | Payer: Managed Care, Other (non HMO) | Source: Ambulatory Visit | Attending: Internal Medicine | Admitting: Internal Medicine

## 2016-03-07 DIAGNOSIS — D509 Iron deficiency anemia, unspecified: Secondary | ICD-10-CM | POA: Diagnosis not present

## 2016-03-07 MED ORDER — SODIUM CHLORIDE 0.9 % IV SOLN
Freq: Once | INTRAVENOUS | Status: AC
  Administered 2016-03-07: 12:00:00 via INTRAVENOUS

## 2016-03-07 MED ORDER — SODIUM CHLORIDE 0.9 % IV SOLN
510.0000 mg | INTRAVENOUS | Status: DC
  Administered 2016-03-07: 510 mg via INTRAVENOUS
  Filled 2016-03-07: qty 17

## 2016-03-07 NOTE — Progress Notes (Signed)
Uneventful infusion of 510mg  FERAHEME #1/2 and 30 minute post infusion observation. Next scheduled appt is 03/14/16

## 2016-03-07 NOTE — Discharge Instructions (Signed)
FERAHEME Ferumoxytol injection What is this medicine? FERUMOXYTOL is an iron complex. Iron is used to make healthy red blood cells, which carry oxygen and nutrients throughout the body. This medicine is used to treat iron deficiency anemia in people with chronic kidney disease. This medicine may be used for other purposes; ask your health care provider or pharmacist if you have questions. What should I tell my health care provider before I take this medicine? They need to know if you have any of these conditions: -anemia not caused by low iron levels -high levels of iron in the blood -magnetic resonance imaging (MRI) test scheduled -an unusual or allergic reaction to iron, other medicines, foods, dyes, or preservatives -pregnant or trying to get pregnant -breast-feeding How should I use this medicine? This medicine is for injection into a vein. It is given by a health care professional in a hospital or clinic setting. Talk to your pediatrician regarding the use of this medicine in children. Special care may be needed. Overdosage: If you think you have taken too much of this medicine contact a poison control center or emergency room at once. NOTE: This medicine is only for you. Do not share this medicine with others. What if I miss a dose? It is important not to miss your dose. Call your doctor or health care professional if you are unable to keep an appointment. What may interact with this medicine? This medicine may interact with the following medications: -other iron products This list may not describe all possible interactions. Give your health care provider a list of all the medicines, herbs, non-prescription drugs, or dietary supplements you use. Also tell them if you smoke, drink alcohol, or use illegal drugs. Some items may interact with your medicine. What should I watch for while using this medicine? Visit your doctor or healthcare professional regularly. Tell your doctor or  healthcare professional if your symptoms do not start to get better or if they get worse. You may need blood work done while you are taking this medicine. You may need to follow a special diet. Talk to your doctor. Foods that contain iron include: whole grains/cereals, dried fruits, beans, or peas, leafy green vegetables, and organ meats (liver, kidney). What side effects may I notice from receiving this medicine? Side effects that you should report to your doctor or health care professional as soon as possible: -allergic reactions like skin rash, itching or hives, swelling of the face, lips, or tongue -breathing problems -changes in blood pressure -feeling faint or lightheaded, falls -fever or chills -flushing, sweating, or hot feelings -swelling of the ankles or feet Side effects that usually do not require medical attention (Report these to your doctor or health care professional if they continue or are bothersome.): -diarrhea -headache -nausea, vomiting -stomach pain This list may not describe all possible side effects. Call your doctor for medical advice about side effects. You may report side effects to FDA at 1-800-FDA-1088. Where should I keep my medicine? This drug is given in a hospital or clinic and will not be stored at home. NOTE: This sheet is a summary. It may not cover all possible information. If you have questions about this medicine, talk to your doctor, pharmacist, or health care provider.    2016, [employer]vier/[employer]. (2011-12-22 15:23:36)

## 2016-03-14 ENCOUNTER — Encounter (HOSPITAL_COMMUNITY): Payer: Self-pay

## 2016-03-14 ENCOUNTER — Encounter (HOSPITAL_COMMUNITY)
Admission: RE | Admit: 2016-03-14 | Discharge: 2016-03-14 | Disposition: A | Discharge status: Home / Self Care | Payer: Managed Care, Other (non HMO) | Source: Ambulatory Visit | Attending: Internal Medicine | Admitting: Internal Medicine

## 2016-03-14 DIAGNOSIS — D509 Iron deficiency anemia, unspecified: Secondary | ICD-10-CM | POA: Diagnosis not present

## 2016-03-14 MED ORDER — SODIUM CHLORIDE 0.9 % IV SOLN
Freq: Once | INTRAVENOUS | Status: AC
  Administered 2016-03-14: 12:00:00 via INTRAVENOUS

## 2016-03-14 MED ORDER — SODIUM CHLORIDE 0.9 % IV SOLN
510.0000 mg | INTRAVENOUS | Status: AC
  Administered 2016-03-14: 510 mg via INTRAVENOUS
  Filled 2016-03-14: qty 17

## 2016-05-08 ENCOUNTER — Other Ambulatory Visit: Payer: Self-pay

## 2016-05-08 DIAGNOSIS — D509 Iron deficiency anemia, unspecified: Secondary | ICD-10-CM

## 2017-03-09 IMAGING — US US ABDOMEN LIMITED
1 series · 14 of 25 positions shown · non-contrast
Comparison: None in PACs

CLINICAL DATA: Onset of right upper quadrant pain last night ;
history of reflux, anemia.

EXAM:
US ABDOMEN LIMITED - RIGHT UPPER QUADRANT

[Series 1: us abdomen limited · 0.21mm/px · 14 of 83 slices shown]
[im 1/83]
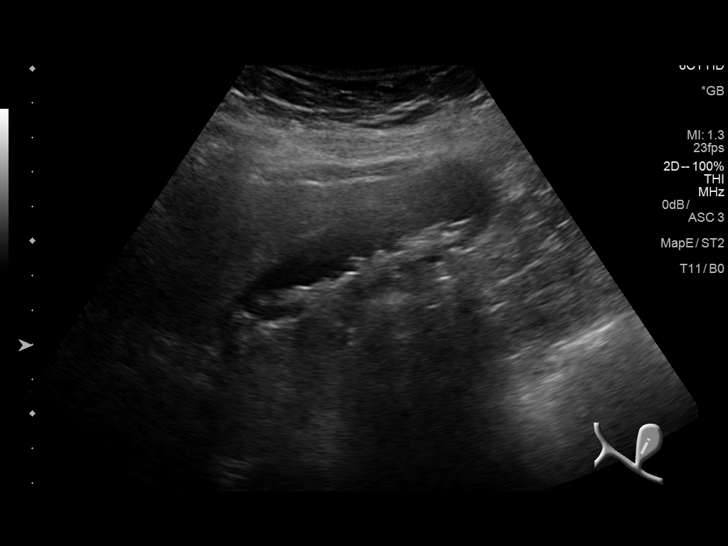
[im 7/83]
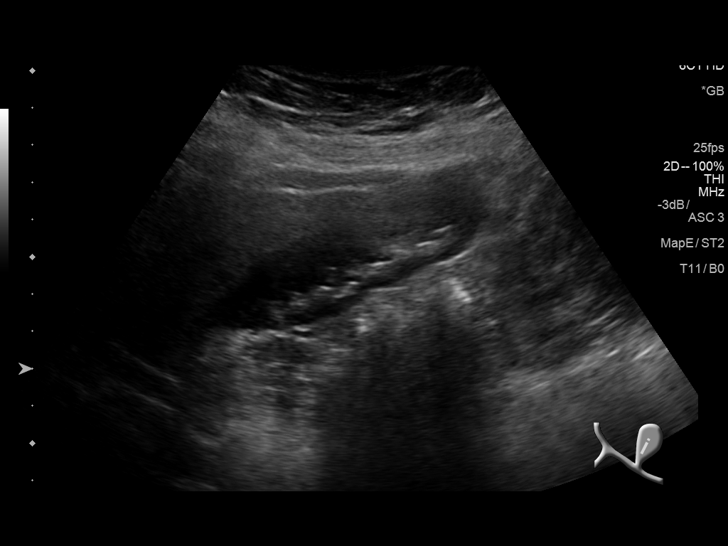
[im 14/83]
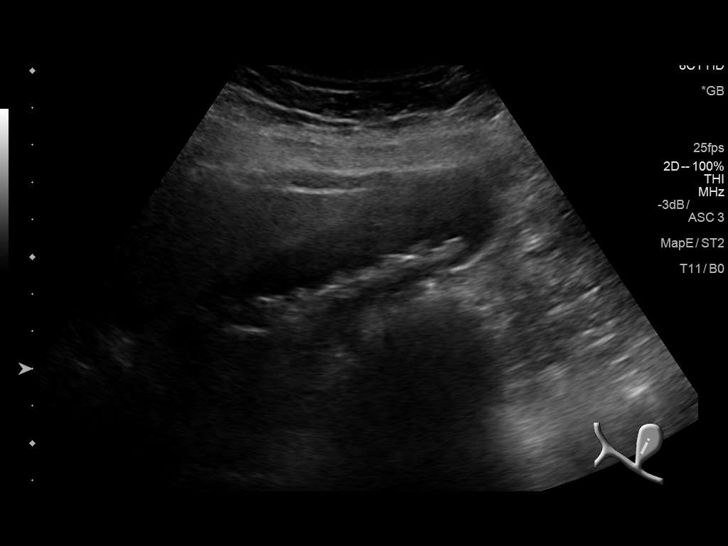
[im 21/83]
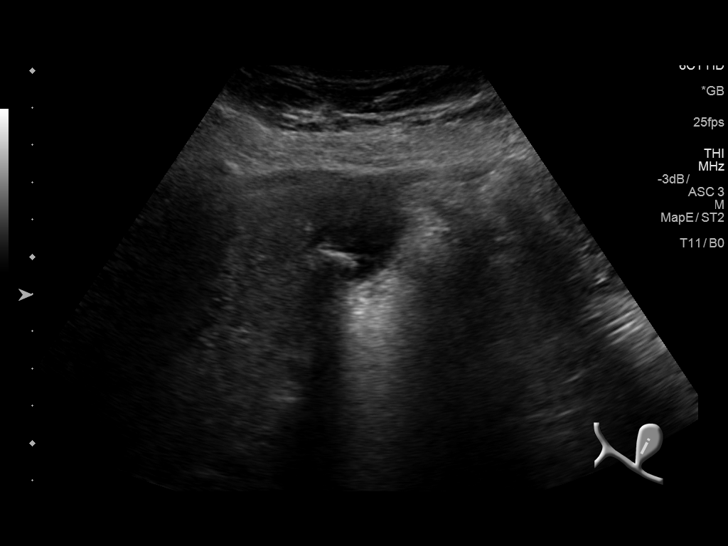
[im 28/83]
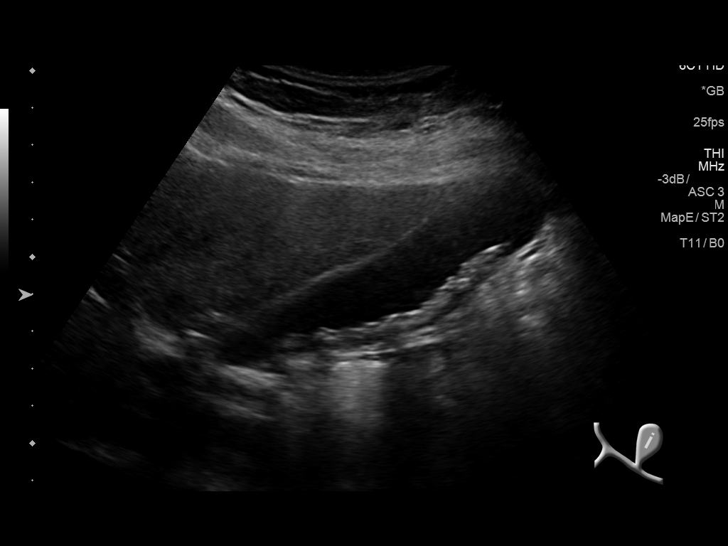
[im 31/83]
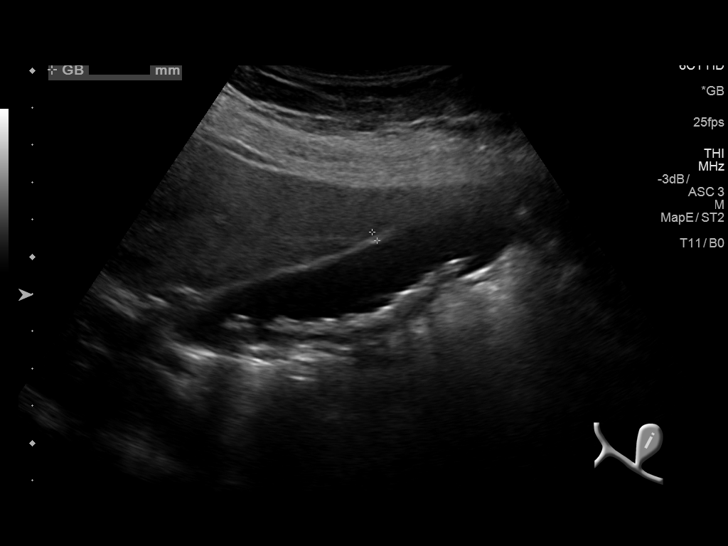
[im 38/83]
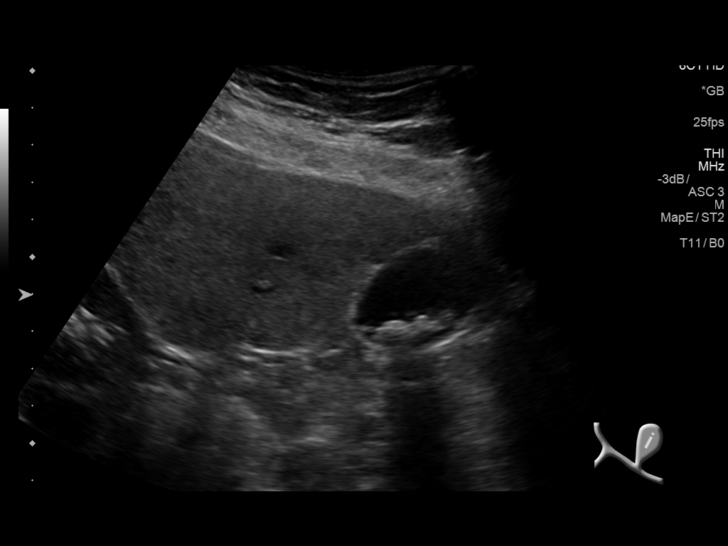
[im 45/83]
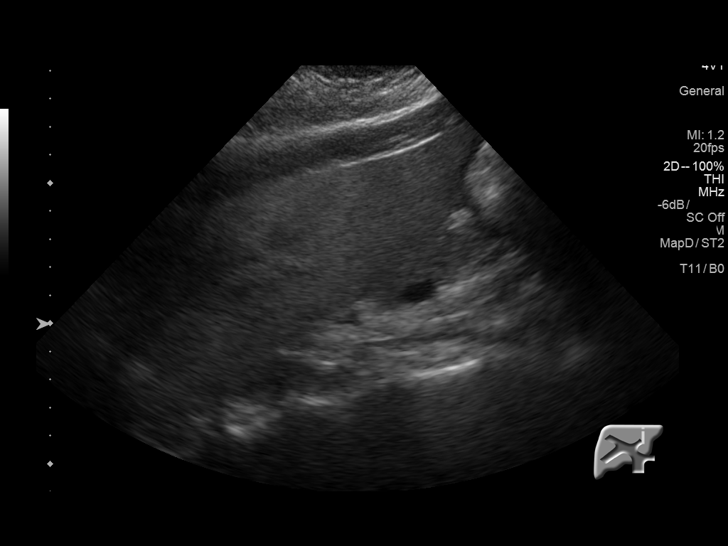
[im 52/83]
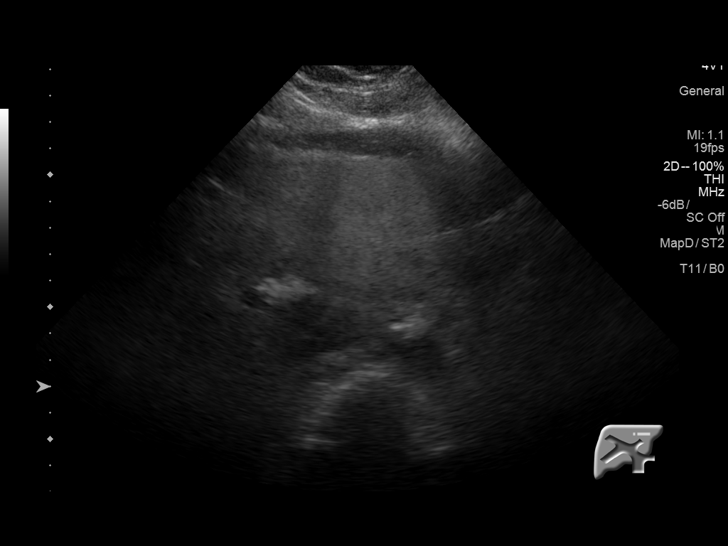
[im 55/83]
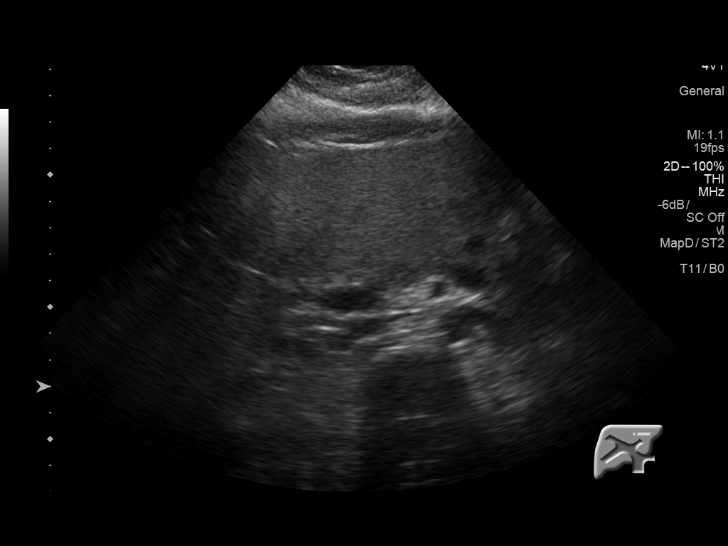
[im 62/83]
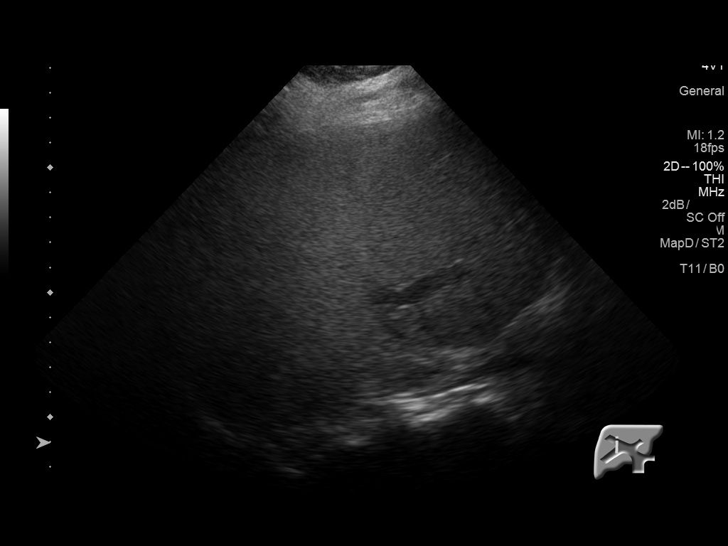
[im 69/83]
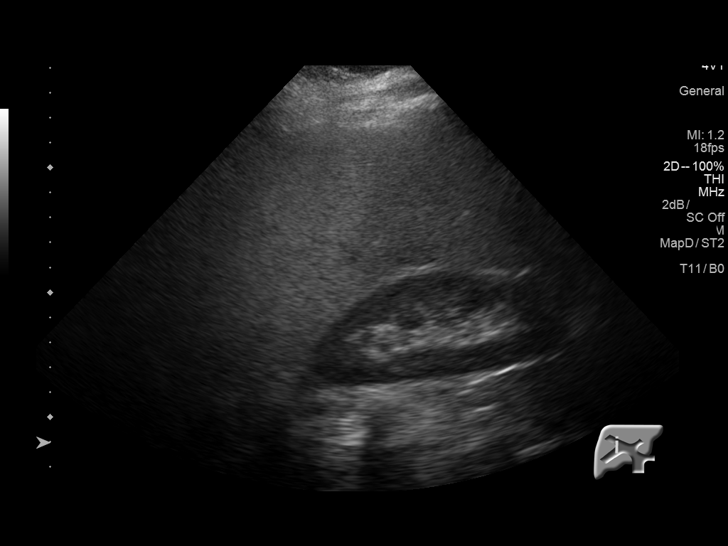
[im 76/83]
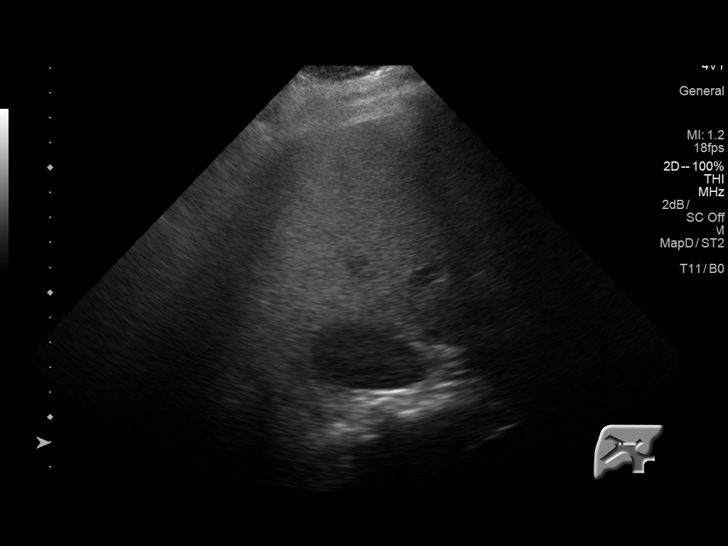
[im 83/83]
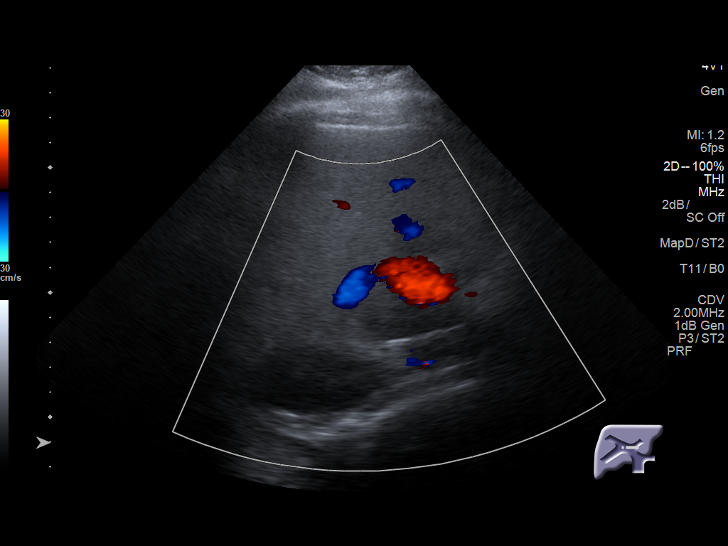

[14 of 25 positions shown; findings below may reference images not displayed]

FINDINGS: Gallbladder:

The gallbladder is adequately distended. There are multiple
echogenic mobile shadowing stones. There is no gallbladder wall
thickening, pericholecystic fluid, or positive sonographic Murphy's
sign.

Common bile duct:

Diameter: 5.5 mm.  No intraluminal stones are observed.

Liver:

Increased hepatic echotexture diffusely. No focal mass or ductal
dilation. The surface contour of the liver is normal.
IMPRESSION: 1. Gallstones without sonographic evidence of acute cholecystitis.
2. Fatty infiltrative change of the liver.

## 2017-05-20 IMAGING — CR DG CHEST 1V PORT
1 series · 1 of 1 positions shown · non-contrast
Comparison: 02/14/2016

CLINICAL DATA: Pulmonary edema

EXAM:
PORTABLE CHEST 1 VIEW

[AP]
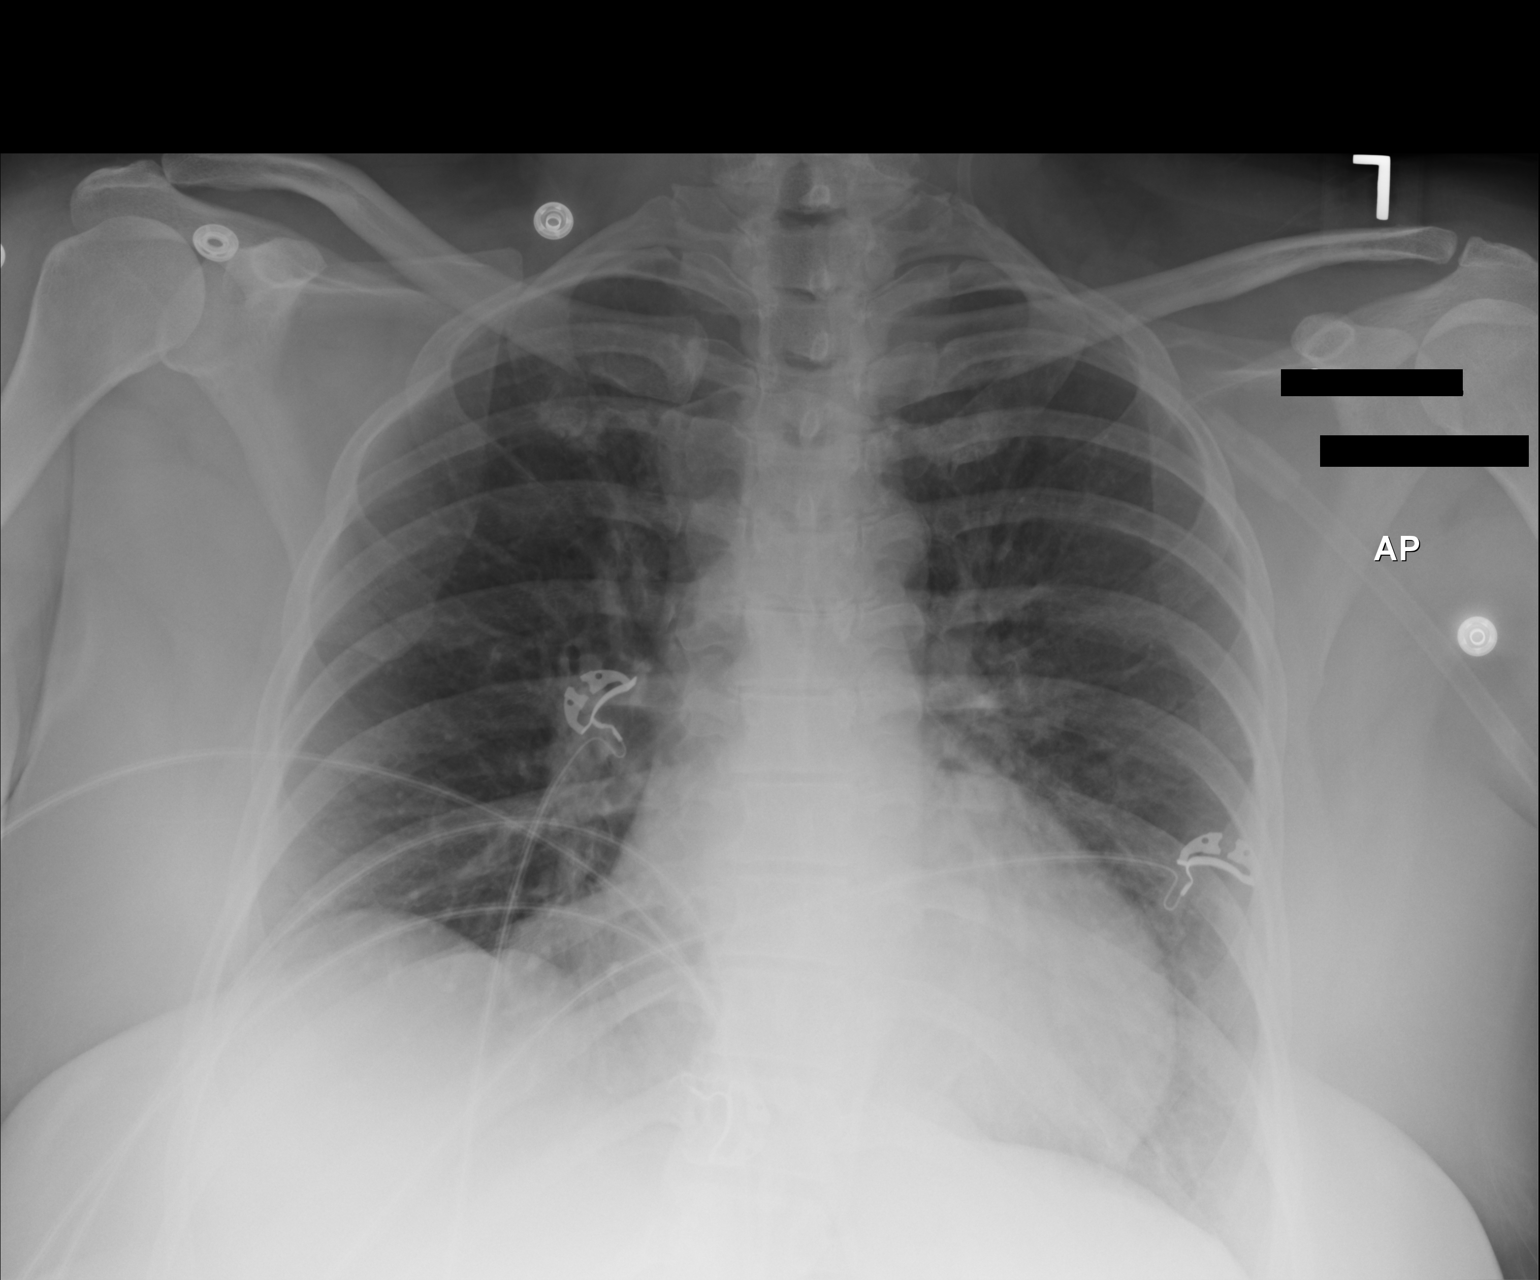

[1 of 1 positions shown; findings below may reference images not displayed]

FINDINGS: Cardiomediastinal silhouette is stable. There is elevation of the
right hemidiaphragm. No segmental infiltrate. Central mild vascular
congestion without convincing pulmonary edema.
IMPRESSION: Elevation of the right hemidiaphragm. Central mild vascular
congestion without convincing pulmonary edema. No segmental
infiltrate.

## 2018-06-01 ENCOUNTER — Other Ambulatory Visit: Payer: Self-pay

## 2018-06-01 ENCOUNTER — Inpatient Hospital Stay (HOSPITAL_COMMUNITY)
Admission: AD | Admit: 2018-06-01 | Discharge: 2018-06-05 | DRG: 194 | Disposition: A | Discharge status: Skilled Nursing Facility | Payer: Managed Care, Other (non HMO) | Source: Other Acute Inpatient Hospital | Attending: Internal Medicine | Admitting: Internal Medicine

## 2018-06-01 ENCOUNTER — Encounter (HOSPITAL_COMMUNITY): Payer: Self-pay

## 2018-06-01 DIAGNOSIS — C801 Malignant (primary) neoplasm, unspecified: Secondary | ICD-10-CM

## 2018-06-01 DIAGNOSIS — E669 Obesity, unspecified: Secondary | ICD-10-CM | POA: Diagnosis present

## 2018-06-01 DIAGNOSIS — Z6841 Body Mass Index (BMI) 40.0 and over, adult: Secondary | ICD-10-CM | POA: Diagnosis not present

## 2018-06-01 DIAGNOSIS — G2581 Restless legs syndrome: Secondary | ICD-10-CM | POA: Diagnosis present

## 2018-06-01 DIAGNOSIS — K219 Gastro-esophageal reflux disease without esophagitis: Secondary | ICD-10-CM | POA: Diagnosis present

## 2018-06-01 DIAGNOSIS — Z79899 Other long term (current) drug therapy: Secondary | ICD-10-CM | POA: Diagnosis not present

## 2018-06-01 DIAGNOSIS — Z8701 Personal history of pneumonia (recurrent): Secondary | ICD-10-CM

## 2018-06-01 DIAGNOSIS — J9601 Acute respiratory failure with hypoxia: Secondary | ICD-10-CM | POA: Diagnosis not present

## 2018-06-01 DIAGNOSIS — F329 Major depressive disorder, single episode, unspecified: Secondary | ICD-10-CM | POA: Diagnosis present

## 2018-06-01 DIAGNOSIS — J181 Lobar pneumonia, unspecified organism: Secondary | ICD-10-CM | POA: Diagnosis not present

## 2018-06-01 DIAGNOSIS — D638 Anemia in other chronic diseases classified elsewhere: Secondary | ICD-10-CM | POA: Diagnosis present

## 2018-06-01 DIAGNOSIS — D509 Iron deficiency anemia, unspecified: Secondary | ICD-10-CM | POA: Diagnosis present

## 2018-06-01 DIAGNOSIS — R9389 Abnormal findings on diagnostic imaging of other specified body structures: Secondary | ICD-10-CM | POA: Diagnosis present

## 2018-06-01 DIAGNOSIS — F419 Anxiety disorder, unspecified: Secondary | ICD-10-CM | POA: Diagnosis not present

## 2018-06-01 DIAGNOSIS — R531 Weakness: Secondary | ICD-10-CM | POA: Diagnosis not present

## 2018-06-01 DIAGNOSIS — R0602 Shortness of breath: Secondary | ICD-10-CM | POA: Diagnosis present

## 2018-06-01 DIAGNOSIS — J45901 Unspecified asthma with (acute) exacerbation: Secondary | ICD-10-CM | POA: Diagnosis not present

## 2018-06-01 DIAGNOSIS — K760 Fatty (change of) liver, not elsewhere classified: Secondary | ICD-10-CM | POA: Diagnosis present

## 2018-06-01 DIAGNOSIS — Z8249 Family history of ischemic heart disease and other diseases of the circulatory system: Secondary | ICD-10-CM | POA: Diagnosis not present

## 2018-06-01 LAB — CBC
HCT: 33.2 % — ABNORMAL LOW (ref 36.0–46.0)
Hemoglobin: 10.5 g/dL — ABNORMAL LOW (ref 12.0–15.0)
MCH: 31.4 pg (ref 26.0–34.0)
MCHC: 31.6 g/dL (ref 30.0–36.0)
MCV: 99.4 fL (ref 80.0–100.0)
Platelets: 314 10*3/uL (ref 150–400)
RBC: 3.34 MIL/uL — ABNORMAL LOW (ref 3.87–5.11)
RDW: 16.9 % — ABNORMAL HIGH (ref 11.5–15.5)
WBC: 6.8 10*3/uL (ref 4.0–10.5)
nRBC: 0 % (ref 0.0–0.2)

## 2018-06-01 LAB — TSH: TSH: 1.038 u[IU]/mL (ref 0.350–4.500)

## 2018-06-01 LAB — GLUCOSE, CAPILLARY: Glucose-Capillary: 138 mg/dL — ABNORMAL HIGH (ref 70–99)

## 2018-06-01 LAB — MAGNESIUM: Magnesium: 2.4 mg/dL (ref 1.7–2.4)

## 2018-06-01 MED ORDER — LISINOPRIL 20 MG PO TABS
20.0000 mg | ORAL_TABLET | Freq: Every day | ORAL | Status: DC
  Administered 2018-06-02 – 2018-06-05 (×4): 20 mg via ORAL
  Filled 2018-06-01 (×4): qty 1

## 2018-06-01 MED ORDER — FLUOXETINE HCL 20 MG PO CAPS
20.0000 mg | ORAL_CAPSULE | Freq: Every day | ORAL | Status: DC
  Administered 2018-06-01 – 2018-06-04 (×4): 20 mg via ORAL
  Filled 2018-06-01 (×4): qty 1

## 2018-06-01 MED ORDER — PREDNISONE 20 MG PO TABS: 40.0000 mg | ORAL_TABLET | Freq: Every day | ORAL | Status: DC

## 2018-06-01 MED ORDER — MOMETASONE FURO-FORMOTEROL FUM 200-5 MCG/ACT IN AERO
2.0000 | INHALATION_SPRAY | Freq: Two times a day (BID) | RESPIRATORY_TRACT | Status: DC
  Administered 2018-06-02: 2 via RESPIRATORY_TRACT
  Filled 2018-06-01: qty 8.8

## 2018-06-01 MED ORDER — PRAMIPEXOLE DIHYDROCHLORIDE 0.25 MG PO TABS
0.7500 mg | ORAL_TABLET | Freq: Every day | ORAL | Status: DC
  Administered 2018-06-01 – 2018-06-04 (×4): 0.75 mg via ORAL
  Filled 2018-06-01 (×5): qty 3

## 2018-06-01 MED ORDER — ALBUTEROL SULFATE (2.5 MG/3ML) 0.083% IN NEBU
2.5000 mg | INHALATION_SOLUTION | RESPIRATORY_TRACT | Status: DC | PRN
  Administered 2018-06-02: 2.5 mg via RESPIRATORY_TRACT
  Filled 2018-06-01: qty 3

## 2018-06-01 MED ORDER — INSULIN ASPART 100 UNIT/ML ~~LOC~~ SOLN
0.0000 [IU] | Freq: Three times a day (TID) | SUBCUTANEOUS | Status: DC
  Administered 2018-06-02: 3 [IU] via SUBCUTANEOUS
  Administered 2018-06-02 – 2018-06-03 (×3): 2 [IU] via SUBCUTANEOUS
  Administered 2018-06-04 (×2): 3 [IU] via SUBCUTANEOUS
  Administered 2018-06-05: 2 [IU] via SUBCUTANEOUS
  Administered 2018-06-05: 3 [IU] via SUBCUTANEOUS

## 2018-06-01 MED ORDER — AZITHROMYCIN 250 MG PO TABS
500.0000 mg | ORAL_TABLET | ORAL | Status: DC
  Administered 2018-06-02 – 2018-06-03 (×2): 500 mg via ORAL
  Filled 2018-06-01 (×2): qty 2

## 2018-06-01 MED ORDER — BUPROPION HCL ER (SR) 100 MG PO TB12
100.0000 mg | ORAL_TABLET | Freq: Every day | ORAL | Status: DC
  Administered 2018-06-02 – 2018-06-05 (×4): 100 mg via ORAL
  Filled 2018-06-01 (×4): qty 1

## 2018-06-01 MED ORDER — METOPROLOL SUCCINATE ER 25 MG PO TB24
25.0000 mg | ORAL_TABLET | Freq: Every day | ORAL | Status: DC
  Administered 2018-06-02 – 2018-06-05 (×4): 25 mg via ORAL
  Filled 2018-06-01 (×4): qty 1

## 2018-06-01 MED ORDER — LAMOTRIGINE 25 MG PO TABS
25.0000 mg | ORAL_TABLET | Freq: Every day | ORAL | Status: DC
  Administered 2018-06-01 – 2018-06-04 (×4): 25 mg via ORAL
  Filled 2018-06-01 (×4): qty 1

## 2018-06-01 MED ORDER — BENZONATATE 100 MG PO CAPS: 100.0000 mg | ORAL_CAPSULE | Freq: Three times a day (TID) | ORAL | Status: DC | PRN

## 2018-06-01 MED ORDER — SODIUM CHLORIDE 0.9 % IV SOLN
1.0000 g | INTRAVENOUS | Status: DC
  Administered 2018-06-02 – 2018-06-03 (×2): 1 g via INTRAVENOUS
  Filled 2018-06-01 (×2): qty 1

## 2018-06-01 MED ORDER — PREDNISONE 20 MG PO TABS
30.0000 mg | ORAL_TABLET | Freq: Every day | ORAL | Status: DC
  Administered 2018-06-02 – 2018-06-03 (×2): 30 mg via ORAL
  Filled 2018-06-01 (×2): qty 1

## 2018-06-01 MED ORDER — ONDANSETRON HCL 4 MG/2ML IJ SOLN
4.0000 mg | Freq: Four times a day (QID) | INTRAMUSCULAR | Status: DC | PRN
  Administered 2018-06-01: 4 mg via INTRAVENOUS
  Filled 2018-06-01: qty 2

## 2018-06-01 MED ORDER — ENOXAPARIN SODIUM 40 MG/0.4ML ~~LOC~~ SOLN
40.0000 mg | SUBCUTANEOUS | Status: DC
  Administered 2018-06-01 – 2018-06-04 (×4): 40 mg via SUBCUTANEOUS
  Filled 2018-06-01 (×4): qty 0.4

## 2018-06-01 NOTE — H&P (Signed)
History and Physical    [name]:[id number] DOB: [birth year] DOA: 06/01/2018  PCP: Yvone Neu, MD  Patient coming from: [place] as transfer  I have personally briefly reviewed patient's old medical records in [place]  Chief Complaint: Persistent SOB, PNA  HPI: Leslie Robinson is a 43 y.o. female with medical history significant of anxiety, obesity, asthma.  Patient was admitted 1 week ago to [facility] with BUL PNA, symptoms had been going on for several weeks and included fever 102.5, cough productive of brown sputum, SOB, wheezing.  She had failed outpatient ABx with clarithromycin.  She was started on steroids, rocephin, azithro.  Infiltrates and fever have mostly improved, though some infiltrates still persist on CT scan of chest (neg for PE), but her SOB and new O2 requirement of 2L has persisted.  Additionally she has developed generalized weakness over the past day or two, now unable to ambulate.  She was transferred to [facility] for pulmonology consultation at request of family.    Review of Systems: As per HPI otherwise 10 point review of systems negative.   Past Medical History:  Diagnosis Date  . Anemia   . Anxiety   . Asthma    childhood  . Fatty liver   . Gallstones   . Gastric ulcer   . GERD (gastroesophageal reflux disease)   . Gestational diabetes   . Heart murmur    "nothing to woorry about"  . Hiatal hernia   . Iron deficiency anemia   . Pneumonia 2017  . Restless leg   . Shingles   . Shortness of breath dyspnea    with exertion  . UTI (lower urinary tract infection)    frequently  . Vitamin D deficiency     Past Surgical History:  Procedure Laterality Date  . CHOLECYSTECTOMY N/A 02/14/2016   Procedure: LAPAROSCOPIC CHOLECYSTECTOMY;  Surgeon: Coralie Keens, MD;  Location: [place];  Service: General;  Laterality: N/A;  . CHOLECYSTECTOMY  02/14/2016  . TUBAL LIGATION  11-27-2014  . WISDOM TOOTH EXTRACTION       reports that she has  never smoked. She has never used smokeless tobacco. She reports that she does not drink alcohol or use drugs.  Allergies  Allergen Reactions  . Tape Rash    Family History  Problem Relation Age of Onset  . Hypertension Mother   . Colon polyps Mother   . Diabetes Mother   . Heart disease Mother   . Colon cancer Maternal Grandmother      Prior to Admission medications   Medication Sig Start Date End Date Taking? Authorizing Provider  albuterol (PROVENTIL HFA;VENTOLIN HFA) 108 (90 Base) MCG/ACT inhaler Inhale 1-2 puffs into the lungs every 6 (six) hours as needed for wheezing or shortness of breath.   Yes [provider]  benzonatate (TESSALON) 100 MG capsule Take 100 mg by mouth as needed for cough.   Yes [provider]  buPROPion (WELLBUTRIN SR) 100 MG 12 hr tablet Take 100 mg by mouth daily after lunch.   Yes [provider]  clarithromycin (BIAXIN) 500 MG tablet Take 500 mg by mouth 2 (two) times daily.   Yes [provider]  FLUoxetine (PROZAC) 20 MG tablet Take 20 mg by mouth daily.   Yes [provider]  lamoTRIgine (LAMICTAL) 25 MG CHEW chewable tablet Chew 25 mg by mouth every evening.   Yes [provider]  lisinopril (PRINIVIL,ZESTRIL) 20 MG tablet Take 20 mg by mouth daily.  Yes [provider]  metoprolol tartrate (LOPRESSOR) 25 MG tablet Take 25 mg by mouth daily.   Yes [provider]  pramipexole (MIRAPEX) 0.75 MG tablet Take 0.125 mg by mouth daily. Reported on 06/09/2015   Yes [provider]  HYDROcodone-acetaminophen (NORCO/VICODIN) 5-325 MG tablet Take 1-2 tablets by mouth every 4 (four) hours as needed. Patient not taking: Reported on 06/01/2018 02/14/16   Coralie Keens, MD  ibuprofen (ADVIL,MOTRIN) 200 MG tablet Take 400 mg by mouth every 6 (six) hours as needed for headache or moderate pain.    [provider]  ondansetron (ZOFRAN) 4 MG tablet Take 1 tablet (4 mg total) by  mouth every 6 (six) hours. Patient not taking: Reported on 06/01/2018 12/02/15   Isla Pence, MD  pantoprazole (PROTONIX) 40 MG tablet Take 1 tablet (40 mg total) by mouth daily. Patient not taking: Reported on 06/01/2018 06/09/15   Jerene Bears, MD    Physical Exam: Vitals:   06/01/18 1742 06/01/18 1743  BP: 131/76 131/76  Pulse: 85 85  Resp: 16 16  Temp: 99.2 F (37.3 C) 99.2 F (37.3 C)  TempSrc:  Oral  SpO2: 99%   Weight:  103.8 kg  Height:  5\' 3"  (1.6 m)    Constitutional: NAD, calm, comfortable Eyes: PERRL, lids and conjunctivae normal ENMT: Mucous membranes are moist. Posterior pharynx clear of any exudate or lesions.Normal dentition.  Neck: normal, supple, no masses, no thyromegaly Respiratory: Cough, diffuse rhonchi. Cardiovascular: Regular rate and rhythm, no murmurs / rubs / gallops. No extremity edema. 2+ pedal pulses. No carotid bruits.  Abdomen: no tenderness, no masses palpated. No hepatosplenomegaly. Bowel sounds positive.  Musculoskeletal: no clubbing / cyanosis. No joint deformity upper and lower extremities. Good ROM, no contractures. Normal muscle tone.  Skin: no rashes, lesions, ulcers. No induration Neurologic: Generalized weakness, tremor when she tries to stand, no focal deficits. Psychiatric: Normal judgment and insight. Alert and oriented x 3. Normal mood.    Labs on Admission: I have personally reviewed following labs and imaging studies  CBC: No results for input(s): WBC, NEUTROABS, HGB, HCT, MCV, PLT in the last 168 hours. Basic Metabolic Panel: No results for input(s): NA, K, CL, CO2, GLUCOSE, BUN, CREATININE, CALCIUM, MG, PHOS in the last 168 hours. GFR: CrCl cannot be calculated (Patient's most recent lab result is older than the maximum 21 days allowed.). Liver Function Tests: No results for input(s): AST, ALT, ALKPHOS, BILITOT, PROT, ALBUMIN in the last 168 hours. No results for input(s): LIPASE, AMYLASE in the last 168 hours. No results  for input(s): AMMONIA in the last 168 hours. Coagulation Profile: No results for input(s): INR, PROTIME in the last 168 hours. Cardiac Enzymes: No results for input(s): CKTOTAL, CKMB, CKMBINDEX, TROPONINI in the last 168 hours. BNP (last 3 results) No results for input(s): PROBNP in the last 8760 hours. HbA1C: No results for input(s): HGBA1C in the last 72 hours. CBG: No results for input(s): GLUCAP in the last 168 hours. Lipid Profile: No results for input(s): CHOL, HDL, LDLCALC, TRIG, CHOLHDL, LDLDIRECT in the last 72 hours. Thyroid Function Tests: No results for input(s): TSH, T4TOTAL, FREET4, T3FREE, THYROIDAB in the last 72 hours. Anemia Panel: No results for input(s): VITAMINB12, FOLATE, FERRITIN, TIBC, IRON, RETICCTPCT in the last 72 hours. Urine analysis:    Component Value Date/Time   COLORURINE YELLOW 12/02/2015 0940   APPEARANCEUR CLEAR 12/02/2015 0940   LABSPEC 1.015 12/02/2015 0940   PHURINE 6.0 12/02/2015 0940   GLUCOSEU NEGATIVE  12/02/2015 0940   HGBUR NEGATIVE 12/02/2015 0940   BILIRUBINUR NEGATIVE 12/02/2015 0940   KETONESUR NEGATIVE 12/02/2015 0940   PROTEINUR NEGATIVE 12/02/2015 0940   NITRITE NEGATIVE 12/02/2015 0940   LEUKOCYTESUR NEGATIVE 12/02/2015 0940    Radiological Exams on Admission: No results found.  EKG: Independently reviewed.  Assessment/Plan Principal Problem:   Infiltrate noted on imaging study Active Problems:   Acute respiratory failure with hypoxia (HCC)   Asthma, chronic, unspecified asthma severity, with acute exacerbation   Generalized weakness   Anxiety    1. CAP - BUL 1. Persistent pulmonary symptoms despite a week of rocephin and azithromycin and steroids 2. Infiltrates improved, but still present in upper lobes on CT scan 3. Fever improved / resolved. 4. O2 requirement of 2L remains persistent 5. CTA neg for PE 6. Plan: 1. PNA pathway 2. Continue rocephin and azithro for the moment 3. Repeat cultures 4. Consult  pulmonology in AM 2. Asthma exacerbation - 1. Continue PRN and scheduled nebs 2. Try to taper steroids some due to concern for possible critical illness neuropathy. 3. Generalized weakness - 1. Check TSH, Mg 2. ? Critical illness neuropathy from steroids? 3. Reduce prednisone to 30mg  for tomorrow 4. Anxiety - continue home meds  DVT prophylaxis: Lovenox Code Status: Full Family Communication: Family at bedside Disposition Plan: Home after admit Consults called: None, call pulm in AM Admission status: Admit to inpatient - transfer from OSH after failing to improve despite appropriate IP treatment, failed outpatient treatment, new O2 requirement persistent, inability to ambulate now.   Etta Quill DO [facility] Pager [phone] Only works nights!  If 7AM-7PM, please contact the primary day team physician taking care of patient  [email or web address] Password [facility]  06/01/2018, 7:46 PM

## 2018-06-01 NOTE — Plan of Care (Signed)
Patient received from [facility] via [place]. Transferred from stretcher to bed in room with assist. Patient c/o weakness and inability to stand or ambulate as she was a few days ago. Will continue to monitor.

## 2018-06-01 NOTE — Progress Notes (Signed)
This is a 43 year old white female who was admitted to [facility] 1 week ago with symptoms consistent for asthma exacerbation and pneumonia.  She had a past medical history notable for asthma, anxiety, depression, and hypertension.  Dr.Purohit called at family request, for transfer to [facility] long for pulmonary evaluation.  She is been treated with steroids antibiotics with repeat CTA negative for PE but notable for infection..  She is currently on ceftriaxone and azithromycin, white count is normal BNP is normal she is on 2 L nasal cannula with a BNP of 22.  She is currently afebrile heart rate of 90-97 blood pressure 150s to 160s over 90s with negative blood cultures. As noted above family requested transfer for pulmonology consultation because of patient's slow progress at outside hospital with a primary concern being persistent pneumonia on CTA.

## 2018-06-02 DIAGNOSIS — J181 Lobar pneumonia, unspecified organism: Secondary | ICD-10-CM

## 2018-06-02 DIAGNOSIS — R9389 Abnormal findings on diagnostic imaging of other specified body structures: Secondary | ICD-10-CM

## 2018-06-02 LAB — EXPECTORATED SPUTUM ASSESSMENT W GRAM STAIN, RFLX TO RESP C

## 2018-06-02 LAB — BASIC METABOLIC PANEL
Anion gap: 13 (ref 5–15)
Anion gap: 12 (ref 5–15)
BUN: 16 mg/dL (ref 6–20)
BUN: 18 mg/dL (ref 6–20)
CHLORIDE: 101 mmol/L (ref 98–111)
CO2: 22 mmol/L (ref 22–32)
CO2: 25 mmol/L (ref 22–32)
CREATININE: 0.82 mg/dL (ref 0.44–1.00)
Calcium: 9.2 mg/dL (ref 8.9–10.3)
Calcium: 9.2 mg/dL (ref 8.9–10.3)
Chloride: 101 mmol/L (ref 98–111)
Creatinine, Ser: 0.75 mg/dL (ref 0.44–1.00)
GFR calc Af Amer: >60 mL/min (ref >60)
GFR calc Af Amer: >60 mL/min (ref >60)
GFR calc non Af Amer: >60 mL/min (ref >60)
GFR calc non Af Amer: >60 mL/min (ref >60)
GLUCOSE: 120 mg/dL — AB (ref 70–99)
Glucose, Bld: 93 mg/dL (ref 70–99)
Potassium: 4.9 mmol/L (ref 3.5–5.1)
Potassium: 3.8 mmol/L (ref 3.5–5.1)
Sodium: 136 mmol/L (ref 135–145)
Sodium: 138 mmol/L (ref 135–145)

## 2018-06-02 LAB — BLOOD GAS, ARTERIAL
Acid-Base Excess: 0.9 mmol/L (ref 0.0–2.0)
Bicarbonate: 24.4 mmol/L (ref 20.0–28.0)
Drawn by: 331471
FIO2: 21
O2 SAT: 95.9 %
Patient temperature: 37
pCO2 arterial: 36.6 mmHg (ref 32.0–48.0)
pH, Arterial: 7.439 (ref 7.350–7.450)
pO2, Arterial: 82 mmHg — ABNORMAL LOW (ref 83.0–108.0)

## 2018-06-02 LAB — GLUCOSE, CAPILLARY
Glucose-Capillary: 83 mg/dL (ref 70–99)
Glucose-Capillary: 95 mg/dL (ref 70–99)
Glucose-Capillary: 124 mg/dL — ABNORMAL HIGH (ref 70–99)
Glucose-Capillary: 181 mg/dL — ABNORMAL HIGH (ref 70–99)
Glucose-Capillary: 125 mg/dL — ABNORMAL HIGH (ref 70–99)

## 2018-06-02 LAB — STREP PNEUMONIAE URINARY ANTIGEN: Strep Pneumo Urinary Antigen: NEGATIVE

## 2018-06-02 LAB — MRSA PCR SCREENING: MRSA by PCR: NEGATIVE

## 2018-06-02 LAB — GRAM STAIN: Gram Stain: NONE SEEN

## 2018-06-02 LAB — CK: Total CK: 63 U/L (ref 38–234)

## 2018-06-02 LAB — HIV ANTIBODY (ROUTINE TESTING W REFLEX): HIV Screen 4th Generation wRfx: NONREACTIVE

## 2018-06-02 MED ORDER — IPRATROPIUM-ALBUTEROL 0.5-2.5 (3) MG/3ML IN SOLN: 3.0000 mL | Freq: Three times a day (TID) | RESPIRATORY_TRACT | Status: DC

## 2018-06-02 MED ORDER — ACETAMINOPHEN 325 MG PO TABS
650.0000 mg | ORAL_TABLET | Freq: Four times a day (QID) | ORAL | Status: DC | PRN
  Administered 2018-06-02 – 2018-06-03 (×3): 650 mg via ORAL
  Filled 2018-06-02 (×3): qty 2

## 2018-06-02 MED ORDER — LEVALBUTEROL HCL 0.63 MG/3ML IN NEBU
0.6300 mg | INHALATION_SOLUTION | Freq: Three times a day (TID) | RESPIRATORY_TRACT | Status: DC
  Filled 2018-06-02: qty 3

## 2018-06-02 MED ORDER — DIAZEPAM 2 MG PO TABS
2.0000 mg | ORAL_TABLET | Freq: Two times a day (BID) | ORAL | Status: DC | PRN
  Administered 2018-06-02 – 2018-06-04 (×4): 2 mg via ORAL
  Filled 2018-06-02 (×4): qty 1

## 2018-06-02 MED ORDER — BUDESONIDE 0.5 MG/2ML IN SUSP
0.5000 mg | Freq: Two times a day (BID) | RESPIRATORY_TRACT | Status: DC
  Administered 2018-06-02 – 2018-06-05 (×7): 0.5 mg via RESPIRATORY_TRACT
  Filled 2018-06-02 (×6): qty 2

## 2018-06-02 MED ORDER — KETOROLAC TROMETHAMINE 15 MG/ML IJ SOLN
7.5000 mg | Freq: Three times a day (TID) | INTRAMUSCULAR | Status: AC | PRN
  Administered 2018-06-02 – 2018-06-04 (×2): 7.5 mg via INTRAVENOUS
  Filled 2018-06-02 (×2): qty 1

## 2018-06-02 MED ORDER — ARFORMOTEROL TARTRATE 15 MCG/2ML IN NEBU
15.0000 ug | INHALATION_SOLUTION | Freq: Two times a day (BID) | RESPIRATORY_TRACT | Status: DC
  Administered 2018-06-02 – 2018-06-05 (×7): 15 ug via RESPIRATORY_TRACT
  Filled 2018-06-02 (×8): qty 2

## 2018-06-02 MED ORDER — IPRATROPIUM-ALBUTEROL 0.5-2.5 (3) MG/3ML IN SOLN
3.0000 mL | Freq: Four times a day (QID) | RESPIRATORY_TRACT | Status: DC
  Administered 2018-06-02 – 2018-06-04 (×10): 3 mL via RESPIRATORY_TRACT
  Filled 2018-06-02 (×10): qty 3

## 2018-06-02 MED ORDER — GUAIFENESIN ER 600 MG PO TB12
600.0000 mg | ORAL_TABLET | Freq: Two times a day (BID) | ORAL | Status: DC
  Administered 2018-06-02 – 2018-06-05 (×7): 600 mg via ORAL
  Filled 2018-06-02 (×7): qty 1

## 2018-06-02 NOTE — Consult Note (Signed)
Requesting Physician: Dr. Edison Simon    Chief Complaint: Progressive weakness  History obtained from: Patient and Chart    HPI:                                                                                                                                       Leslie Robinson is an 43 y.o. female with past medical history of anxiety, obesity, restless leg syndrome, iron deficiency anemia, asthma transferred to [facility] long hospital for pulmonology consultation progressive shortness of breath after presenting to [facility] 1 week ago with pneumonia.  Patient was admitted about 1 week ago and noted to have productive cough, fever of 102.5, shortness of breath and wheezing.  She was started on steroids Rocephin and azithromycin.  Her shortness of breath and oxygen requirement has persisted despite her fever resolving as well as most of the infiltrates have improved.  She also has not been able to ambulate since 1/9 and family feels she will begin to progressively get worse.  He feels generalized weakness and when asked how it progressed she states that-first she had a pain in the back of her head with some shooting pain/numbness.  She then noticed she had trouble walking.  She states that she has always had some weakness in her legs but after her admission, she is gotten much worse and barely able to ambulate for the last 2 to 3 days requiring maximum assist.  She denies any numbness/tingling however does say that she has itching of her feet intermittently.  Neurology was consulted for evaluation of progressive weakness.    Review of systems patient states that she has been having intermittent memory problems, fogginess.   Past Medical History:  Diagnosis Date  . Anemia   . Anxiety   . Asthma    childhood  . Fatty liver   . Gallstones   . Gastric ulcer   . GERD (gastroesophageal reflux disease)   . Gestational diabetes   . Heart murmur    "nothing to woorry about"  . Hiatal hernia   . Iron  deficiency anemia   . Pneumonia 2017  . Restless leg   . Shingles   . Shortness of breath dyspnea    with exertion  . UTI (lower urinary tract infection)    frequently  . Vitamin D deficiency     Past Surgical History:  Procedure Laterality Date  . CHOLECYSTECTOMY N/A 02/14/2016   Procedure: LAPAROSCOPIC CHOLECYSTECTOMY;  Surgeon: Coralie Keens, MD;  Location: [place];  Service: General;  Laterality: N/A;  . CHOLECYSTECTOMY  02/14/2016  . TUBAL LIGATION  11-27-2014  . WISDOM TOOTH EXTRACTION      Family History  Problem Relation Age of Onset  . Hypertension Mother   . Colon polyps Mother   . Diabetes Mother   . Heart disease Mother   . Colon cancer Maternal Grandmother    Social History:  reports that she has never smoked. She has never used smokeless tobacco. She reports that she does not drink alcohol or use drugs.  Allergies:  Allergies  Allergen Reactions  . Tape Rash    Medications:                                                                                                                        I reviewed home medications.  She is on Wellbutrin, lamotrigine likely for mood, she is on pramipexole for restless leg syndrome.    ROS:                                                                                                                                     14 systems reviewed and negative except above   Examination:                                                                                                      General: Appears well-developed and well-nourished.  Psych: Appears slightly anxious Eyes: No scleral injection HENT: No OP obstrucion Head: Normocephalic.  Cardiovascular: Normal rate and regular rhythm.  Respiratory: Effort normal and breath sounds normal to anterior ascultation GI: Soft.  No distension. There is no tenderness.  Skin: WDI    Neurological Examination Mental Status: Alert, oriented, thought content appropriate.  Speech  fluent without evidence of aphasia. Able to follow 3 step commands without difficulty. Cranial Nerves: II: Visual fields grossly normal,  III,IV, VI: ptosis not present, extra-ocular motions intact bilaterally, pupils equal, round, reactive to light and accommodation V,VII: smile symmetric, facial light touch sensation normal bilaterally VIII: hearing normal bilaterally IX,X: uvula rises symmetrically XI: bilateral shoulder shrug XII: midline tongue extension Motor: Right : Upper extremity    Left:     Upper extremity 5/5 deltoid       5/5 deltoid 5/5 tricep      5/5 tricep 5/5 biceps      5/5 biceps  5/5wrist flexion  5/5 wrist flexion 5/5 wrist extension     5/5 wrist extension 5/5 hand grip      5/5 hand grip  Lower extremity     Lower extremity 4/5 hip flexor      4/5 hip flexor 5/5 hip adductors     5/5 hip adductors 4/5 hip abductors     4/5 hip abductors 5/5 quadricep      5/5 quadriceps  5/5 hamstrings     5/5 hamstrings 5/5 plantar flexion       5/5 plantar flexion 5/5 plantar extension     5/5 plantar extension Tone and bulk:normal tone throughout; no atrophy noted Sensory:  Mildly reduced sensation to pinprick, temperature over the anterior aspect of the right foot and leg extending up to mid shin.  Vibration and position sense intact.  Deep Tendon Reflexes:  Right: Upper Extremity   Left: Upper extremity   biceps (C-5 to C-6) 2/4   biceps (C-5 to C-6) 2/4 tricep (C7) 2/4    triceps (C7) 2/4 Brachioradialis (C6) 2/4  Brachioradialis (C6) 2/4  Lower Extremity Lower Extremity  quadriceps (L-2 to L-4) 2/4   quadriceps (L-2 to L-4) 2/4 Achilles (S1) 2/4   Achilles (S1) 2/4 Plantars: Right: downgoing   Left: downgoing Cerebellar: normal finger-to-nose, normal rapid alternating movements and normal heel-to-shin test Gait: Unable to stand up without wobbling.     Lab Results: Basic Metabolic Panel: Recent Labs  Lab 06/01/18 2022 06/02/18 0708  NA 136 138   K 4.9 3.8  CL 101 101  CO2 22 25  GLUCOSE 120* 93  BUN 16 18  CREATININE 0.82 0.75  CALCIUM 9.2 9.2  MG 2.4  --     CBC: Recent Labs  Lab 06/01/18 2022  WBC 6.8  HGB 10.5*  HCT 33.2*  MCV 99.4  PLT 314    Coagulation Studies: No results for input(s): LABPROT, INR in the last 72 hours.  Imaging: No results found.   ASSESSMENT AND PLAN   43 year old female with pneumonia admitted to [facility] hospital for progressive shortness of breath.  She needs 2 L nasal flow oxygen.  She underwent ABG which showed no hypercarbia to suggest that she has neuromuscular respiratory weakness.  Ck was performed which was 56, which would suggest against steroid-induced myopathy.   On examination, patient did not appear to have much weakness.  She had 5 x 5 strength in upper extremities, however  appeared to get short of breath and fatigued while testing.  Testing her lower extremities initially it appeared that she had weakness to hip flexors, however and laying her down and encouraging her she was able to raise against gravity against resistance.  She had 5 x 5 strength in both plantar and dorsiflexion.  Reflexes were normal.  She had no loss of vibration position sense.  No finger-nose ataxia.   Yet while attempting to stand her up patient appeared to be unsteady on her feet.  I feel this is inconsistent with the level of strength she demonstrated while testing individual muscles.  There is definitely a component of anxiety on examination, she initially appeared short of breath and started hyperventilating while laying down  in bed, however as I continue to test her neuromuscular strength shortness of breath appeared to resolve.   Impression: Generalized fatigue Deconditioning  Other considerations less likely : Steroid myopathy(no tenderness, normal CK, myasthenia gravis -(no ptosis, dysarthria or dysphagia),  Guillain-Barr-(reflexes are intact, no significant sensory symptoms),peripheral  neuropathy (sensory exam  is mostly intact except for impaired temperature over bilateral feet and pinprick over right feet?),  Lumbar radiculopathy-should not cause the degree of gait imbalance.    Recommendations PT/OT consult Continue to address anxiety, shortness of breath and fatigue Check B12, TSH ( normal)  If patient strength does not improve in 3 to 4 days, please reconsult neurology and we may consider additional testing such as MRI brain, MRI T and L-spine.  Outpatient neurology for chronic complaints of leg weakness, memory problems.  Deakon Frix [facility] Pager Number [phone]

## 2018-06-02 NOTE — Progress Notes (Signed)
Called to room for patient feeling like she is going to pass out. On entry to room, patient found in chair; responsive but slow to respond. Seemed  unable to keep her eyes open at the time. Called patient name, could see patient moving her head but no response. Could not lift her arm for blood pressure cuff application or lift finger for blood sugar check. Rapid response called; 2 nurses responded - vital signs obtained and patient monitored. Episode lasted a couple of minutes and patient began to respond to questions but was slow and sluggish to respond; voice muffled with slow response. Stated she felt really lightheaded and sluggish. Over the next couple of minutes, responsiveness improved and voice became more clear. Patient states she does not feel well and still does not feel back to normal. Rapid response left. MD paged, episode explained and request for orthostatic VS.   Lying HR 89 BP 139/90  Sitting HR 87 BP 137/93  Standing (with 2 person assist) HR 96 BP 150/122  MD paged for result. Will continue to monitor.

## 2018-06-02 NOTE — Progress Notes (Signed)
PROGRESS NOTE  Katti Pelle JKK:[id number] DOB: [birth year] DOA: 06/01/2018 PCP: Yvone Neu, MD  HPI/Recap of past 24 hours:  Intermittent congested cough, no hypoxia at rest, no fever C/o progressive weakness  Family at bedside  Assessment/Plan: Principal Problem:   Infiltrate noted on imaging study Active Problems:   Acute respiratory failure with hypoxia (HCC)   Asthma, chronic, unspecified asthma severity, with acute exacerbation   Generalized weakness   Anxiety  Persistent cough/wheezing/bilateral upper lobe pneumonia -not improving with two rounds of outpatient treatment and inpatient treatment at OSH since 1/5 -start nebs/mucinex -pulm consulted  Progressive weakness -reports was able to walk at OSH until two days ago, now not able to walk, needs two person assist to get up, it is hard to hold the cup, difficult raise arm above shoulder, denies dysphagia, reports sob but does not appear in respiratory distress -steroid induced vs GBS?   Anemia of chronic disease hgb stable at baseline around 10  Code Status: full  Family Communication: patient and family  Disposition Plan: not ready to discharge   Consultants:  Pulmonology  neurology  Procedures:  none  Antibiotics:  Rocephin/zithro   Objective: BP (!) 150/122 (BP Location: Right Arm)   Pulse 96   Temp 98.2 F (36.8 C) (Oral)   Resp 16   Ht 5\' 3"  (1.6 m)   Wt 103.8 kg   SpO2 99%   BMI 40.54 kg/m   Intake/Output Summary (Last 24 hours) at 06/02/2018 1007 Last data filed at 06/02/2018 0507 Gross per 24 hour  Intake 420 ml  Output -  Net 420 ml   Filed Weights   06/01/18 1743  Weight: 103.8 kg    Exam: Patient is examined daily including today on 06/02/2018, exams remain the same as of yesterday except that has changed    General:  NAD, appear weak  Cardiovascular: RRR  Respiratory: bilateral diffuse wheezing  Abdomen: Soft/ND/NT, positive BS  Musculoskeletal: No  Edema  Neuro: alert, oriented   Data Reviewed: Basic Metabolic Panel: Recent Labs  Lab 06/01/18 2022 06/02/18 0708  NA 136 138  K 4.9 3.8  CL 101 101  CO2 22 25  GLUCOSE 120* 93  BUN 16 18  CREATININE 0.82 0.75  CALCIUM 9.2 9.2  MG 2.4  --    Liver Function Tests: No results for input(s): AST, ALT, ALKPHOS, BILITOT, PROT, ALBUMIN in the last 168 hours. No results for input(s): LIPASE, AMYLASE in the last 168 hours. No results for input(s): AMMONIA in the last 168 hours. CBC: Recent Labs  Lab 06/01/18 2022  WBC 6.8  HGB 10.5*  HCT 33.2*  MCV 99.4  PLT 314   Cardiac Enzymes:   No results for input(s): CKTOTAL, CKMB, CKMBINDEX, TROPONINI in the last 168 hours. BNP (last 3 results) No results for input(s): BNP in the last 8760 hours.  ProBNP (last 3 results) No results for input(s): PROBNP in the last 8760 hours.  CBG: Recent Labs  Lab 06/01/18 2205 06/02/18 0743 06/02/18 0758  GLUCAP 138* 83 95    Recent Results (from the past 240 hour(s))  Culture, blood (routine x 2) Call MD if unable to obtain prior to antibiotics being given     Status: None (Preliminary result)   Collection Time: 06/01/18  8:22 PM  Result Value Ref Range Status   Specimen Description   Final    BLOOD LEFT ANTECUBITAL Performed at [facility] [address], [place], [place] [address]  Special Requests   Final    BOTTLES DRAWN AEROBIC ONLY Blood Culture adequate volume Performed at [facility] [address], [place], [place] [address]    Culture   Final    NO GROWTH < 12 HOURS Performed at [facility] [address], [place], [place] [address]    Report Status PENDING  Incomplete  Culture, blood (routine x 2) Call MD if unable to obtain prior to antibiotics being given     Status: None (Preliminary result)   Collection Time: 06/01/18 10:26 PM  Result Value Ref Range Status   Specimen Description   Final    BLOOD RIGHT  ANTECUBITAL Performed at [facility] [address], [place], [place] [address]    Special Requests   Final    BOTTLES DRAWN AEROBIC ONLY Blood Culture adequate volume Performed at [facility] [address], [place], [place] [address]    Culture   Final    NO GROWTH < 12 HOURS Performed at [facility] [address] [place], [place] [address]    Report Status PENDING  Incomplete     Studies: No results found.  Scheduled Meds: . azithromycin  500 mg Oral Q24H  . buPROPion  100 mg Oral QAC lunch  . enoxaparin (LOVENOX) injection  40 mg Subcutaneous Q24H  . FLUoxetine  20 mg Oral QHS  . insulin aspart  0-15 Units Subcutaneous TID WC  . ipratropium-albuterol  3 mL Nebulization Q6H  . lamoTRIgine  25 mg Oral QHS  . lisinopril  20 mg Oral Daily  . metoprolol succinate  25 mg Oral Daily  . mometasone-formoterol  2 puff Inhalation BID  . pramipexole  0.75 mg Oral QHS  . predniSONE  30 mg Oral Q breakfast    Continuous Infusions: . cefTRIAXone (ROCEPHIN)  IV       Time spent: 37mins,  I have personally reviewed and interpreted on  06/02/2018 daily labs, imagings as discussed above under date review session and assessment and plans.  I reviewed all nursing notes, pharmacy notes, consultant notes,  vitals, pertinent old records  I have discussed plan of care as described above with RN , patient and family on 06/02/2018   Florencia Reasons MD, PhD  [facility] Pager [phone]. If 7PM-7AM, please contact night-coverage at [email or web address], password [facility] 06/02/2018, 10:07 AM  LOS: 1 day

## 2018-06-02 NOTE — Progress Notes (Signed)
Vital capacity after 3 attempts average VC was 3.1.  NIF was 32.

## 2018-06-02 NOTE — Consult Note (Signed)
NAME:  Leslie Robinson, MRN:  [id number], DOB:  1975-11-09, LOS: 1 ADMISSION DATE:  06/01/2018, CONSULTATION DATE:  06/02/2018 REFERRING MD:  Dillard Cannon MD, CHIEF COMPLAINT:  CAP, asthma exacerbation  Brief History   43 year old with history of asthma, community-acquired pneumonia.  PCCM consulted for persistent dyspnea, wheezing and lung infiltrate after multiple rounds of antibiotics   History of present illness   Symptoms started in mid December with bronchitis which was treated with a Medrol Dosepak.  She then had strep throat and sinus infection treated as an outpatient with 2 rounds of antibiotic.  First with cefdinir and second with clarithromycin.  She continued to have persistent dyspnea, cough and presented to [facility] on 1[facility] with fevers, chest x-ray showing patchy bilateral upper lobe airspace opacities. Flu was ruled out. She was treated as community-acquired pneumonia with ceftriaxone and Zithromax Follow-up chest x-ray in 1/10 showed clear lungs.  However as she continued to have persistent dyspnea she got a CTA on 1/11 which showed no PE but patchy airspace disease in the upper lobes.  She was transferred to [facility] at the request of family for pulmonary evaluation.  Also noted to have progressive generalized weakness for which neurology was consulted.  She is a non-smoker with no known exposures.  She has a cat at home with no other indoor animals No mold exposure, humidifier, hot tub, Jacuzzi.  No significant travel history.  Past Medical History  Asthma as a child.  She is not on inhalers as an outpatient  GERD, iron deficiency anemia, restless leg syndrome  Significant Hospital Events     Consults:  PCCM, neurology  Procedures:    Significant Diagnostic Tests:  Imaging from [facility] Chest x-ray 05/26/2018-patchy bilateral upper lobe airspace disease consistent with pneumonia right greater than left Chest x-ray 05/29/2018-clearing of previously noted lung opacities Chest x-ray  05/01/2019-no active disease CTA 06/01/2018-negative for PE.  Patchy airspace disease in the anterior right upper lobe and posterior left upper lobe.  Micro Data:    Antimicrobials:  Ceftriaxone 1/5 >> Zithromax 1/5>>  Interim history/subjective:    Objective   Blood pressure (!) 150/122, pulse 96, temperature 98.2 F (36.8 C), temperature source Oral, resp. rate 16, height 5\' 3"  (1.6 m), weight 103.8 kg, SpO2 99 %.        Intake/Output Summary (Last 24 hours) at 06/02/2018 1024 Last data filed at 06/02/2018 1008 Gross per 24 hour  Intake 540 ml  Output -  Net 540 ml   Filed Weights   06/01/18 1743  Weight: 103.8 kg    Examination: Blood pressure (!) 150/122, pulse 96, temperature 98.2 F (36.8 C), temperature source Oral, resp. rate 16, height 5\' 3"  (1.6 m), weight 103.8 kg, SpO2 99 %. Gen:      No acute distress HEENT:  EOMI, sclera anicteric Neck:     No masses; no thyromegaly Lungs:    Bilateral expiratory wheeze. CV:         Regular rate and rhythm; no murmurs Abd:      + bowel sounds; soft, non-tender; no palpable masses, no distension Ext:    No edema; adequate peripheral perfusion Skin:      Warm and dry; no rash Neuro: alert and oriented x 3 Psych: normal mood and affect  Resolved Hospital Problem list     Assessment & Plan:  Community-acquired pneumonia, asthma exacerbation I have reviewed the imaging from [facility].  Chest x-ray shows improvement in opacities but CT scan still shows mild  persistent upper lobe infiltrates which will take some time to clear. Still continues to be bronchospastic with wheezing  Will be okay to stop antibiotics as she is already received 7 days of IV ceftriaxone and Zithromax.   We will continue prednisone at current dose of 30 mg with no increase due to concern for steroid induced weakness but I think it is less likely as she has not been on prolonged course of high-dose steroids. Stop dulera while she is inpatient Start  Brovana and Pulmicort nebs Continue duo nebs, albuterol as needed  Generalized weakness. ? GBS Neurology consulted to evaluate ABG ordered to check for Co2 retention Check NIF, vital capacity.  Labs   CBC: Recent Labs  Lab 06/01/18 2022  WBC 6.8  HGB 10.5*  HCT 33.2*  MCV 99.4  PLT 233    Basic Metabolic Panel: Recent Labs  Lab 06/01/18 2022 06/02/18 0708  NA 136 138  K 4.9 3.8  CL 101 101  CO2 22 25  GLUCOSE 120* 93  BUN 16 18  CREATININE 0.82 0.75  CALCIUM 9.2 9.2  MG 2.4  --    GFR: Estimated Creatinine Clearance: 105.6 mL/min (by C-G formula based on SCr of 0.75 mg/dL). Recent Labs  Lab 06/01/18 2022  WBC 6.8    Liver Function Tests: No results for input(s): AST, ALT, ALKPHOS, BILITOT, PROT, ALBUMIN in the last 168 hours. No results for input(s): LIPASE, AMYLASE in the last 168 hours. No results for input(s): AMMONIA in the last 168 hours.  ABG No results found for: PHART, PCO2ART, PO2ART, HCO3, TCO2, ACIDBASEDEF, O2SAT   Coagulation Profile: No results for input(s): INR, PROTIME in the last 168 hours.  Cardiac Enzymes: No results for input(s): CKTOTAL, CKMB, CKMBINDEX, TROPONINI in the last 168 hours.  HbA1C: No results found for: HGBA1C  CBG: Recent Labs  Lab 06/01/18 2205 06/02/18 0743 06/02/18 0758  GLUCAP 138* 83 95    Review of Systems:   REVIEW OF SYSTEMS:   All negative; except for those that are bolded, which indicate positives.  Constitutional: weight loss, weight gain, night sweats, fevers, chills, fatigue, weakness.  HEENT: headaches, sore throat, sneezing, nasal congestion, post nasal drip, difficulty swallowing, tooth/dental problems, visual complaints, visual changes, ear aches. Neuro: difficulty with speech, weakness, numbness, ataxia. CV:  chest pain, orthopnea, PND, swelling in lower extremities, dizziness, palpitations, syncope.  Resp: cough, hemoptysis, dyspnea, wheezing. GI: heartburn, indigestion, abdominal pain,  nausea, vomiting, diarrhea, constipation, change in bowel habits, loss of appetite, hematemesis, melena, hematochezia.  GU: dysuria, change in color of urine, urgency or frequency, flank pain, hematuria. MSK: joint pain or swelling, decreased range of motion. Psych: change in mood or affect, depression, anxiety, suicidal ideations, homicidal ideations. Skin: rash, itching, bruising.  Past Medical History  She,  has a past medical history of Anemia, Anxiety, Asthma, Fatty liver, Gallstones, Gastric ulcer, GERD (gastroesophageal reflux disease), Gestational diabetes, Heart murmur, Hiatal hernia, Iron deficiency anemia, Pneumonia (2017), Restless leg, Shingles, Shortness of breath dyspnea, UTI (lower urinary tract infection), and Vitamin D deficiency.   Surgical History    Past Surgical History:  Procedure Laterality Date  . CHOLECYSTECTOMY N/A 02/14/2016   Procedure: LAPAROSCOPIC CHOLECYSTECTOMY;  Surgeon: Coralie Keens, MD;  Location: [facility];  Service: General;  Laterality: N/A;  . CHOLECYSTECTOMY  02/14/2016  . TUBAL LIGATION  11-27-2014  . WISDOM TOOTH EXTRACTION       Social History   reports that she has never smoked. She has never used smokeless tobacco.  She reports that she does not drink alcohol or use drugs.   Family History   Her family history includes Colon cancer in her maternal grandmother; Colon polyps in her mother; Diabetes in her mother; Heart disease in her mother; Hypertension in her mother.   Allergies Allergies  Allergen Reactions  . Tape Rash     Home Medications  Prior to Admission medications   Medication Sig Start Date End Date Taking? Authorizing Provider  benzonatate (TESSALON) 100 MG capsule Take 100 mg by mouth as needed for cough.   Yes [provider]  buPROPion (WELLBUTRIN SR) 100 MG 12 hr tablet Take 100 mg by mouth daily after lunch.   Yes [provider]  CAPSAICIN EX Apply 1 application topically 2 (two) times daily. Applied to  the chest area   Yes [provider]  FLUoxetine (PROZAC) 20 MG tablet Take 20 mg by mouth daily.   Yes [provider]  lamoTRIgine (LAMICTAL) 25 MG CHEW chewable tablet Chew 25 mg by mouth every evening.   Yes [provider]  lisinopril (PRINIVIL,ZESTRIL) 20 MG tablet Take 20 mg by mouth daily.   Yes [provider]  metoprolol tartrate (LOPRESSOR) 25 MG tablet Take 25 mg by mouth daily.   Yes [provider]  pramipexole (MIRAPEX) 0.75 MG tablet Take 0.125 mg by mouth daily. Reported on 06/09/2015   Yes [provider]  clarithromycin (BIAXIN) 500 MG tablet Take 500 mg by mouth 2 (two) times daily.    [provider]  HYDROcodone-acetaminophen (NORCO/VICODIN) 5-325 MG tablet Take 1-2 tablets by mouth every 4 (four) hours as needed. Patient not taking: Reported on 06/01/2018 02/14/16   Coralie Keens, MD  ondansetron (ZOFRAN) 4 MG tablet Take 1 tablet (4 mg total) by mouth every 6 (six) hours. Patient not taking: Reported on 06/01/2018 12/02/15   Isla Pence, MD  pantoprazole (PROTONIX) 40 MG tablet Take 1 tablet (40 mg total) by mouth daily. Patient not taking: Reported on 06/01/2018 06/09/15   Pyrtle, Lajuan Lines, MD     Marshell Garfinkel MD [facility] Pager [phone] If no answer call [phone] 06/02/2018, 11:00 AM

## 2018-06-02 NOTE — Progress Notes (Signed)
NIF -28 VC 2.1L  Pt had good effort.

## 2018-06-02 NOTE — Progress Notes (Signed)
Spoke with lab for critical value. Gram stain on urine showed no WBC but did show gram + cocci. Dr. Erlinda Hong paged with information.

## 2018-06-03 ENCOUNTER — Other Ambulatory Visit (HOSPITAL_COMMUNITY): Payer: Self-pay

## 2018-06-03 ENCOUNTER — Other Ambulatory Visit: Payer: Self-pay

## 2018-06-03 ENCOUNTER — Inpatient Hospital Stay: Payer: Self-pay

## 2018-06-03 ENCOUNTER — Telehealth: Payer: Self-pay | Admitting: Pulmonary Disease

## 2018-06-03 DIAGNOSIS — J181 Lobar pneumonia, unspecified organism: Principal | ICD-10-CM

## 2018-06-03 DIAGNOSIS — F419 Anxiety disorder, unspecified: Secondary | ICD-10-CM

## 2018-06-03 DIAGNOSIS — R531 Weakness: Secondary | ICD-10-CM

## 2018-06-03 LAB — GLUCOSE, CAPILLARY
GLUCOSE-CAPILLARY: 94 mg/dL (ref 70–99)
Glucose-Capillary: 141 mg/dL — ABNORMAL HIGH (ref 70–99)
Glucose-Capillary: 134 mg/dL — ABNORMAL HIGH (ref 70–99)
Glucose-Capillary: 112 mg/dL — ABNORMAL HIGH (ref 70–99)

## 2018-06-03 LAB — BASIC METABOLIC PANEL
Anion gap: 10 (ref 5–15)
BUN: 13 mg/dL (ref 6–20)
CO2: 25 mmol/L (ref 22–32)
CREATININE: 0.72 mg/dL (ref 0.44–1.00)
Calcium: 9.3 mg/dL (ref 8.9–10.3)
Chloride: 101 mmol/L (ref 98–111)
GFR calc Af Amer: >60 mL/min (ref >60)
GFR calc non Af Amer: >60 mL/min (ref >60)
GLUCOSE: 102 mg/dL — AB (ref 70–99)
Potassium: 3.9 mmol/L (ref 3.5–5.1)
Sodium: 136 mmol/L (ref 135–145)

## 2018-06-03 LAB — CBC WITH DIFFERENTIAL/PLATELET
Abs Immature Granulocytes: 0.08 10*3/uL — ABNORMAL HIGH (ref 0.00–0.07)
Basophils Absolute: 0 10*3/uL (ref 0.0–0.1)
Basophils Relative: 0 %
Eosinophils Absolute: 0 10*3/uL (ref 0.0–0.5)
Eosinophils Relative: 0 %
HCT: 34.9 % — ABNORMAL LOW (ref 36.0–46.0)
Hemoglobin: 11 g/dL — ABNORMAL LOW (ref 12.0–15.0)
Immature Granulocytes: 1 %
Lymphocytes Relative: 29 %
Lymphs Abs: 2.1 10*3/uL (ref 0.7–4.0)
MCH: 31.3 pg (ref 26.0–34.0)
MCHC: 31.5 g/dL (ref 30.0–36.0)
MCV: 99.4 fL (ref 80.0–100.0)
MONO ABS: 0.3 10*3/uL (ref 0.1–1.0)
Monocytes Relative: 5 %
NEUTROS ABS: 4.7 10*3/uL (ref 1.7–7.7)
Neutrophils Relative %: 65 %
Platelets: 351 10*3/uL (ref 150–400)
RBC: 3.51 MIL/uL — AB (ref 3.87–5.11)
RDW: 16.6 % — ABNORMAL HIGH (ref 11.5–15.5)
WBC: 7.2 10*3/uL (ref 4.0–10.5)
nRBC: 0 % (ref 0.0–0.2)

## 2018-06-03 LAB — HIV ANTIBODY (ROUTINE TESTING W REFLEX): HIV SCREEN 4TH GENERATION: NONREACTIVE

## 2018-06-03 LAB — MPO/PR-3 (ANCA) ANTIBODIES
ANCA Proteinase 3: <3.5 U/mL (ref 0.0–3.5)
Myeloperoxidase Abs: <9 U/mL (ref 0.0–9.0)

## 2018-06-03 LAB — MAGNESIUM: Magnesium: 2.2 mg/dL (ref 1.7–2.4)

## 2018-06-03 MED ORDER — PREDNISONE 20 MG PO TABS
20.0000 mg | ORAL_TABLET | Freq: Every day | ORAL | Status: DC
  Administered 2018-06-04 – 2018-06-05 (×2): 20 mg via ORAL
  Filled 2018-06-03 (×2): qty 1

## 2018-06-03 NOTE — Progress Notes (Signed)
NIF -24 with fair effott

## 2018-06-03 NOTE — Telephone Encounter (Signed)
[facility],    Will you please arrange for a CT of chest without contrast for Mrs. Hogg to follow up on pneumonia.  She will be seen by Dr. Loanne Drilling on 2/24 at 2pm. If the scan can be completed a few days before that would be helpful or the day of the appt.  I was not sure if she will need prior authorization for imaging.  Let me know if you need anything else.    Thank you!  Noe Gens, NP-C [facility] Pgr: [phone] or if no answer [phone] 06/03/2018, 2:03 PM

## 2018-06-03 NOTE — Progress Notes (Signed)
PROGRESS NOTE  Leslie Robinson KTG:[id number] DOB: [birth year] DOA: 06/01/2018 PCP: Yvone Neu, MD  HPI/Recap of past 24 hours:  Intermittent congested cough, no hypoxia at rest, no fever C/o generalized weakness  Family at bedside  Assessment/Plan: Principal Problem:   Infiltrate noted on imaging study Active Problems:   Acute respiratory failure with hypoxia (HCC)   Asthma, chronic, unspecified asthma severity, with acute exacerbation   Generalized weakness   Anxiety   Lobar pneumonia ([facility])  Persistent cough/wheezing/bilateral upper lobe pneumonia -not improving with two rounds of outpatient treatment and inpatient treatment at OSH since 1/5 -start nebs/mucinex -pulm consulted , appreciate input, off abx per pulm recommendation  Progressive weakness -reports was able to walk at OSH until two days ago, now not able to walk, needs two person assist to get up, it is hard to hold the cup, difficult raise arm above shoulder, denies dysphagia, reports sob but does not appear in respiratory distress -ck unremarkable, less likely steroid induced - GBS? Neurology consulted who recommend address anxiety, check b12, start PT, if not improving in 3-4days, check mri brain, t/l spine -Outpatient neurology for chronic complaints of leg weakness, memory problems.  Anemia of chronic disease hgb stable at baseline around 10  Code Status: full  Family Communication: patient and family  Disposition Plan: pending clinical improvement   Consultants:  Pulmonology  neurology  Procedures:  none  Antibiotics:  Rocephin/zithro from admission to 1/13   Objective: BP 123/87 (BP Location: Right Arm)   Pulse 79   Temp 98.3 F (36.8 C) (Oral)   Resp 17   Ht 5\' 3"  (1.6 m)   Wt 103.8 kg   SpO2 96%   BMI 40.54 kg/m   Intake/Output Summary (Last 24 hours) at 06/03/2018 1237 Last data filed at 06/03/2018 0948 Gross per 24 hour  Intake 840 ml  Output 2750 ml  Net -1910 ml    Filed Weights   06/01/18 1743  Weight: 103.8 kg    Exam: Patient is examined daily including today on 06/03/2018, exams remain the same as of yesterday except that has changed    General:  NAD, appear weak  Cardiovascular: RRR  Respiratory: bilateral diffuse wheezing has much improved today  Abdomen: Soft/ND/NT, positive BS  Musculoskeletal: No Edema  Neuro: alert, oriented   Data Reviewed: Basic Metabolic Panel: Recent Labs  Lab 06/01/18 2022 06/02/18 0708 06/03/18 0801  NA 136 138 136  K 4.9 3.8 3.9  CL 101 101 101  CO2 22 25 25   GLUCOSE 120* 93 102*  BUN 16 18 13   CREATININE 0.82 0.75 0.72  CALCIUM 9.2 9.2 9.3  MG 2.4  --  2.2   Liver Function Tests: No results for input(s): AST, ALT, ALKPHOS, BILITOT, PROT, ALBUMIN in the last 168 hours. No results for input(s): LIPASE, AMYLASE in the last 168 hours. No results for input(s): AMMONIA in the last 168 hours. CBC: Recent Labs  Lab 06/01/18 2022 06/03/18 0801  WBC 6.8 7.2  NEUTROABS  --  4.7  HGB 10.5* 11.0*  HCT 33.2* 34.9*  MCV 99.4 99.4  PLT 314 351   Cardiac Enzymes:   Recent Labs  Lab 06/02/18 1056  CKTOTAL 63   BNP (last 3 results) No results for input(s): BNP in the last 8760 hours.  ProBNP (last 3 results) No results for input(s): PROBNP in the last 8760 hours.  CBG: Recent Labs  Lab 06/02/18 1129 06/02/18 1650 06/02/18 2238 06/03/18 0724 06/03/18 1135  GLUCAP 124*  181* 125* 94 141*    Recent Results (from the past 240 hour(s))  Culture, sputum-assessment     Status: None   Collection Time: 06/01/18  7:03 PM  Result Value Ref Range Status   Specimen Description SPUTUM  Final   Special Requests NONE  Final   Sputum evaluation   Final    THIS SPECIMEN IS ACCEPTABLE FOR SPUTUM CULTURE Performed at [facility], [place] [address], [place], [place] [address]    Report Status 06/02/2018 FINAL  Final  Gram stain     Status: None   Collection Time: 06/01/18   7:03 PM  Result Value Ref Range Status   Specimen Description TRACHEAL SITE  Final   Special Requests NONE  Final   Gram Stain   Final    NO WBC SEEN GRAM POSITIVE COCCI CRITICAL RESULT CALLED TO, READ BACK BY AND VERIFIED WITH: D.MICHAUX,RN [id number] @1804  BY V.WILKINS QUESTIONABLE RESULTS, RECOMMEND RECOLLECT TO VERIFY GRAM STAIN WAS PERFORMED IN ERROR ON URINE, ORDER WILL BE CREDITED, CALLED D.MICHAUX,RN [id number] @1816  BY V.WILKINS Performed at [facility], [place] [address] [place], [place] [address]    Report Status 06/02/2018 FINAL  Final  Culture, respiratory     Status: None (Preliminary result)   Collection Time: 06/01/18  7:03 PM  Result Value Ref Range Status   Specimen Description   Final    SPUTUM Performed at [facility] [address], [place], [place] [address]    Special Requests   Final    NONE Reflexed from [id number] Performed at [facility], [place] [address] [place], [place] [address]    Gram Stain   Final    FEW WBC PRESENT, PREDOMINANTLY PMN FEW GRAM POSITIVE COCCI FEW GRAM POSITIVE RODS FEW GRAM NEGATIVE RODS    Culture   Final    CULTURE REINCUBATED FOR BETTER GROWTH Performed at [facility], [place] [address] [place], [place] [address]    Report Status PENDING  Incomplete  Culture, blood (routine x 2) Call MD if unable to obtain prior to antibiotics being given     Status: None (Preliminary result)   Collection Time: 06/01/18  8:22 PM  Result Value Ref Range Status   Specimen Description   Final    BLOOD LEFT ANTECUBITAL Performed at [facility] [address], [place], [place] [address]    Special Requests   Final    BOTTLES DRAWN AEROBIC ONLY Blood Culture adequate volume Performed at [facility] [address], [place], [place] [address]    Culture   Final    NO GROWTH 2 DAYS Performed at [facility] [address] [place], [place]  [address]    Report Status PENDING  Incomplete  Culture, blood (routine x 2) Call MD if unable to obtain prior to antibiotics being given     Status: None (Preliminary result)   Collection Time: 06/01/18 10:26 PM  Result Value Ref Range Status   Specimen Description   Final    BLOOD RIGHT ANTECUBITAL Performed at [facility] [address] [place], [place] [address]    Special Requests   Final    BOTTLES DRAWN AEROBIC ONLY Blood Culture adequate volume Performed at [facility] [address], [place],  [address]    Culture   Final    NO GROWTH 2 DAYS Performed at [facility] [address]  [place], [place] [address]    Report Status PENDING  Incomplete  MRSA PCR Screening     Status: None   Collection Time: 06/02/18  9:48 AM  Result Value Ref Range Status   MRSA by PCR NEGATIVE NEGATIVE Final    Comment:        The GeneXpert MRSA Assay (FDA approved for NASAL specimens only), is one component of a comprehensive MRSA colonization surveillance program. It is not intended to diagnose MRSA infection nor to guide or monitor treatment for MRSA infections. Performed at [facility], [place] [address], [place], [place] [address]      Studies: No results found.  Scheduled Meds: . arformoterol  15 mcg Nebulization BID  . budesonide (PULMICORT) nebulizer solution  0.5 mg Nebulization BID  . buPROPion  100 mg Oral QAC lunch  . enoxaparin (LOVENOX) injection  40 mg Subcutaneous Q24H  . FLUoxetine  20 mg Oral QHS  . guaiFENesin  600 mg Oral BID  . insulin aspart  0-15 Units Subcutaneous TID WC  . ipratropium-albuterol  3 mL Nebulization Q6H  . lamoTRIgine  25 mg Oral QHS  . lisinopril  20 mg Oral Daily  . metoprolol succinate  25 mg Oral Daily  . pramipexole  0.75 mg Oral QHS  . [START ON 06/04/2018] predniSONE  20 mg Oral Q breakfast    Continuous Infusions:    Time spent: 55mins,  I have personally  reviewed and interpreted on  06/03/2018 daily labs, imagings as discussed above under date review session and assessment and plans.  I reviewed all nursing notes, pharmacy notes, consultant notes,  vitals, pertinent old records  I have discussed plan of care as described above with RN , patient and family on 06/03/2018   Florencia Reasons MD, PhD  [facility] Pager [phone]. If 7PM-7AM, please contact night-coverage at [email or web address], password [facility] 06/03/2018, 12:37 PM  LOS: 2 days

## 2018-06-03 NOTE — Progress Notes (Signed)
NIF -40+ VC 2.85L
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
## 2018-06-03 NOTE — Plan of Care (Signed)
  Problem: Education: Goal: Knowledge of General Education information will improve Description: Including pain rating scale, medication(s)/side effects and non-pharmacologic comfort measures Outcome: Progressing   Problem: Clinical Measurements: Goal: Respiratory complications will improve Outcome: Progressing   Problem: Activity: Goal: Risk for activity intolerance will decrease Outcome: Progressing   Problem: Coping: Goal: Level of anxiety will decrease Outcome: Progressing   Problem: Safety: Goal: Ability to remain free from injury will improve Outcome: Progressing   

## 2018-06-04 LAB — BASIC METABOLIC PANEL
Anion gap: 13 (ref 5–15)
BUN: 17 mg/dL (ref 6–20)
CO2: 23 mmol/L (ref 22–32)
Calcium: 9.7 mg/dL (ref 8.9–10.3)
Chloride: 103 mmol/L (ref 98–111)
Creatinine, Ser: 0.84 mg/dL (ref 0.44–1.00)
GFR calc Af Amer: >60 mL/min (ref >60)
GFR calc non Af Amer: >60 mL/min (ref >60)
Glucose, Bld: 100 mg/dL — ABNORMAL HIGH (ref 70–99)
Potassium: 4.1 mmol/L (ref 3.5–5.1)
Sodium: 139 mmol/L (ref 135–145)

## 2018-06-04 LAB — GLUCOSE, CAPILLARY
Glucose-Capillary: 88 mg/dL (ref 70–99)
Glucose-Capillary: 185 mg/dL — ABNORMAL HIGH (ref 70–99)
Glucose-Capillary: 156 mg/dL — ABNORMAL HIGH (ref 70–99)
Glucose-Capillary: 131 mg/dL — ABNORMAL HIGH (ref 70–99)

## 2018-06-04 LAB — ANCA TITERS
Atypical P-ANCA titer: <1:20 {titer}
C-ANCA: <1:20 {titer}

## 2018-06-04 LAB — VITAMIN B12: VITAMIN B 12: 246 pg/mL (ref 180–914)

## 2018-06-04 MED ORDER — IPRATROPIUM-ALBUTEROL 0.5-2.5 (3) MG/3ML IN SOLN
3.0000 mL | Freq: Two times a day (BID) | RESPIRATORY_TRACT | Status: DC
  Administered 2018-06-05: 3 mL via RESPIRATORY_TRACT
  Filled 2018-06-04: qty 3

## 2018-06-04 NOTE — Progress Notes (Signed)
PROGRESS NOTE  Leslie Robinson ZMO:[id number] DOB: 01/08/1976 DOA: 06/01/2018 PCP: Yvone Neu, MD  HPI/Recap of past 24 hours:  Breathing is better, no cough, no wheezing this am Denies chest pain, no hypoxia, no fever However, she reports feeling weaker, PT now recommend SNF placement which she agrees to   Family at bedside  Assessment/Plan: Principal Problem:   Infiltrate noted on imaging study Active Problems:   Acute respiratory failure with hypoxia (HCC)   Asthma, chronic, unspecified asthma severity, with acute exacerbation   Generalized weakness   Anxiety   Lobar pneumonia ([place])  Persistent cough/wheezing/bilateral upper lobe pneumonia -not improving with two rounds of outpatient treatment and inpatient treatment at OSH since 1/5 -start nebs/mucinex, wheezing resolved,  -pulm consulted , appreciate input, off abx per pulm recommendation -she is to follow up with pulmonology Dr Loanne Drilling on 2/24, she is to have repeat ct chest prior to 2/24, pulmonology will arrange.  Progressive weakness -reports was able to walk at OSH until two days ago, now not able to walk, needs two person assist to get up, it is hard to hold the cup, difficult raise arm above shoulder, denies dysphagia, reports sob but does not appear in respiratory distress -ck unremarkable, less likely steroid induced - Neurology consulted who does not think this is GBS, recommend address anxiety,  b12 unremarkable, start PT who recommend SNF placement -patient reports she has similar issues in the past when she go through stress/illness, at one time she was wheelchair bound , she is advised to follow up with behavorial health  -consider mri brain, t/l spine if not improving in 3-4days -Outpatient neurology for chronic complaints of leg weakness, memory problems.  Anemia of chronic disease hgb stable at baseline around 10  Anxiety/depression: Follow up with behavorial health   Code Status:  full  Family Communication: patient and family  Disposition Plan: SNF placement when bed is available   Consultants:  Pulmonology  neurology  Procedures:  none  Antibiotics:  Rocephin/zithro from admission to 1/13   Objective: BP 136/78 (BP Location: Right Arm)   Pulse 98   Temp 98.4 F (36.9 C) (Oral)   Resp 16   Ht 5\' 3"  (1.6 m)   Wt 103.8 kg   SpO2 100%   BMI 40.54 kg/m   Intake/Output Summary (Last 24 hours) at 06/04/2018 1416 Last data filed at 06/04/2018 1411 Gross per 24 hour  Intake 720 ml  Output 550 ml  Net 170 ml   Filed Weights   06/01/18 1743  Weight: 103.8 kg    Exam: Patient is examined daily including today on 06/04/2018, exams remain the same as of yesterday except that has changed    General:  NAD, appear weak  Cardiovascular: RRR  Respiratory: bilateral diffuse wheezing has resolved today, lung clear bilaterally   Abdomen: Soft/ND/NT, positive BS  Musculoskeletal: No Edema  Neuro: alert, oriented   Data Reviewed: Basic Metabolic Panel: Recent Labs  Lab 06/01/18 2022 06/02/18 0708 06/03/18 0801 06/04/18 0524  NA 136 138 136 139  K 4.9 3.8 3.9 4.1  CL 101 101 101 103  CO2 22 25 25 23   GLUCOSE 120* 93 102* 100*  BUN 16 18 13 17   CREATININE 0.82 0.75 0.72 0.84  CALCIUM 9.2 9.2 9.3 9.7  MG 2.4  --  2.2  --    Liver Function Tests: No results for input(s): AST, ALT, ALKPHOS, BILITOT, PROT, ALBUMIN in the last 168 hours. No results for input(s): LIPASE, AMYLASE  in the last 168 hours. No results for input(s): AMMONIA in the last 168 hours. CBC: Recent Labs  Lab 06/01/18 2022 06/03/18 0801  WBC 6.8 7.2  NEUTROABS  --  4.7  HGB 10.5* 11.0*  HCT 33.2* 34.9*  MCV 99.4 99.4  PLT 314 351   Cardiac Enzymes:   Recent Labs  Lab 06/02/18 1056  CKTOTAL 63   BNP (last 3 results) No results for input(s): BNP in the last 8760 hours.  ProBNP (last 3 results) No results for input(s): PROBNP in the last 8760  hours.  CBG: Recent Labs  Lab 06/03/18 1135 06/03/18 1633 06/03/18 2212 06/04/18 0749 06/04/18 1153  GLUCAP 141* 134* 112* 88 185*    Recent Results (from the past 240 hour(s))  Culture, sputum-assessment     Status: None   Collection Time: 06/01/18  7:03 PM  Result Value Ref Range Status   Specimen Description SPUTUM  Final   Special Requests NONE  Final   Sputum evaluation   Final    THIS SPECIMEN IS ACCEPTABLE FOR SPUTUM CULTURE Performed at [facility], [place] [address], [place], [place] [address]    Report Status 06/02/2018 FINAL  Final  Gram stain     Status: None   Collection Time: 06/01/18  7:03 PM  Result Value Ref Range Status   Specimen Description TRACHEAL SITE  Final   Special Requests NONE  Final   Gram Stain   Final    NO WBC SEEN GRAM POSITIVE COCCI CRITICAL RESULT CALLED TO, READ BACK BY AND VERIFIED WITH: D.MICHAUX,RN [id number] @1804  BY V.WILKINS QUESTIONABLE RESULTS, RECOMMEND RECOLLECT TO VERIFY GRAM STAIN WAS PERFORMED IN ERROR ON URINE, ORDER WILL BE CREDITED, CALLED D.MICHAUX,RN [id number] @1816  BY V.WILKINS Performed at [facility], [place] [address], [place], [place] [address]    Report Status 06/02/2018 FINAL  Final  Culture, respiratory     Status: None (Preliminary result)   Collection Time: 06/01/18  7:03 PM  Result Value Ref Range Status   Specimen Description   Final    SPUTUM Performed at [facility] [address] [place], [place] [address]    Special Requests   Final    NONE Reflexed from [id number] Performed at [facility], [place] [address], [place], [place] [address]    Gram Stain   Final    FEW WBC PRESENT, PREDOMINANTLY PMN FEW GRAM POSITIVE COCCI FEW GRAM POSITIVE RODS FEW GRAM NEGATIVE RODS    Culture   Final    FEW Consistent with normal respiratory flora. Performed at [facility], [place] [address] [place], [place] [address]    Report Status PENDING   Incomplete  Culture, blood (routine x 2) Call MD if unable to obtain prior to antibiotics being given     Status: None (Preliminary result)   Collection Time: 06/01/18  8:22 PM  Result Value Ref Range Status   Specimen Description   Final    BLOOD LEFT ANTECUBITAL Performed at [facility] [address], [place], [place] [address]    Special Requests   Final    BOTTLES DRAWN AEROBIC ONLY Blood Culture adequate volume Performed at [facility] [address] [place], [place] [address]    Culture   Final    NO GROWTH 3 DAYS Performed at [facility], [place] [address], [place], [place] [address]    Report Status PENDING  Incomplete  Culture, blood (routine  x 2) Call MD if unable to obtain prior to antibiotics being given     Status: None (Preliminary result)   Collection Time: 06/01/18 10:26 PM  Result Value Ref Range Status   Specimen Description   Final    BLOOD RIGHT ANTECUBITAL Performed at [facility] [address] [place], [place] [address]    Special Requests   Final    BOTTLES DRAWN AEROBIC ONLY Blood Culture adequate volume Performed at [facility] [address] [place], [place] [address]    Culture   Final    NO GROWTH 3 DAYS Performed at [facility], [place] [address] [place], [place] [address]    Report Status PENDING  Incomplete  MRSA PCR Screening     Status: None   Collection Time: 06/02/18  9:48 AM  Result Value Ref Range Status   MRSA by PCR NEGATIVE NEGATIVE Final    Comment:        The GeneXpert MRSA Assay (FDA approved for NASAL specimens only), is one component of a comprehensive MRSA colonization surveillance program. It is not intended to diagnose MRSA infection nor to guide or monitor treatment for MRSA infections. Performed at [facility], [place] [address] [place], [place] [address]      Studies: No results found.  Scheduled  Meds: . arformoterol  15 mcg Nebulization BID  . budesonide (PULMICORT) nebulizer solution  0.5 mg Nebulization BID  . buPROPion  100 mg Oral QAC lunch  . enoxaparin (LOVENOX) injection  40 mg Subcutaneous Q24H  . FLUoxetine  20 mg Oral QHS  . guaiFENesin  600 mg Oral BID  . insulin aspart  0-15 Units Subcutaneous TID WC  . ipratropium-albuterol  3 mL Nebulization Q6H  . lamoTRIgine  25 mg Oral QHS  . lisinopril  20 mg Oral Daily  . metoprolol succinate  25 mg Oral Daily  . pramipexole  0.75 mg Oral QHS  . predniSONE  20 mg Oral Q breakfast    Continuous Infusions:    Time spent: 72mins,  I have personally reviewed and interpreted on  06/04/2018 daily labs, imagings as discussed above under date review session and assessment and plans.  I reviewed all nursing notes, pharmacy notes, consultant notes,  vitals, pertinent old records  I have discussed plan of care as described above with RN , patient and family on 06/04/2018   Florencia Reasons MD, PhD  [facility] Pager [phone]. If 7PM-7AM, please contact night-coverage at [email or web address], password [facility] 06/04/2018, 2:16 PM  LOS: 3 days

## 2018-06-04 NOTE — Progress Notes (Signed)
NIF: -40+ VC: 2.9L

## 2018-06-04 NOTE — Clinical Social Work Note (Signed)
Clinical Social Work Assessment  Patient Details  Name: Leslie Robinson MRN: [id number] Date of Birth: January 31, 1976  Date of referral:  06/04/18               Reason for consult:  Facility Placement, Discharge Planning                Permission sought to share information with:  Family Supports Permission granted to share information::  Yes, Verbal Permission Granted  Name::     Abbie Berling  Agency::     Relationship::  [facility]  Contact Information:  [phone]  Housing/Transportation Living arrangements for the past 2 months:  [place] of Information:  Patient Patient Interpreter Needed:  None Criminal Activity/Legal Involvement Pertinent to Current Situation/Hospitalization:  No - Comment as needed Significant Relationships:  Adult Children, Dependent Children Lives with:  Adult Children, Minor Children Do you feel safe going back to the place where you live?  Yes Need for family participation in patient care:  Yes (Comment)  Care giving concerns:  Patient had aunt and cousin at bedside during assessment. Patient gave verbal permission for family to be present during assessment. Patient stated she lives at home with 32, 72 and 3 y/o children. patient stated she has family in the area that can also help if needed.    Social Worker assessment / plan:  CSW met patient and family at bedside to discuss discharge plans. Patient stated she would prefer to go home and receive Home health. CSW relayed patients decision to [facility].  CSW and RNCM spoke with patient at bedside. RNCM stated to patient that due to her insurance obtaining home health will take 1-2 weeks. Patient stated she would really like to go home but understands that due to her insurance it will take time. Patient stated he would like some time to think about possibly going to SNF. Patient stated she would like to be in the [place] area but understands due to the where she lives that snf are limited.  Employment  status:  Unemployed Forensic scientist:  Other (Comment Required)([employer]) PT Recommendations:  [place] / Referral to community resources:  [place]  Patient/Family's Response to care:  Patient appreciative of CSW role in care  Patient/Family's Understanding of and Emotional Response to Diagnosis, Current Treatment, and Prognosis:  CSW will continue to follow for discharge needs  Emotional Assessment Appearance:  Appears stated age Attitude/Demeanor/Rapport:  Engaged Affect (typically observed):  Accepting Orientation:  Oriented to Self, Oriented to Place, Oriented to  Time, Oriented to Situation Alcohol / Substance use:  Not Applicable Psych involvement (Current and /or in the community):  No (Comment)  Discharge Needs  Concerns to be addressed:  Care Coordination Readmission within the last 30 days:  No Current discharge risk:  Dependent with Mobility Barriers to Discharge:  [place], LCSW 06/04/2018, 3:45 PM

## 2018-06-04 NOTE — Progress Notes (Signed)
Pt demonstrated good effort.  NIF-36, VC 2.5L

## 2018-06-04 NOTE — Evaluation (Signed)
Physical Therapy Evaluation Patient Details Name: Leslie Robinson MRN: [id number] DOB: July 09, 1975 Today's Date: 06/04/2018   History of Present Illness  43 yo female admitted with Pna, weakness, inability to ambulate. hx of obesity, anxiety, RLS  Clinical Impression  On eval, pt required Min-Mod assist +2 for mobility. Pt presents with general weakness, decreased activity tolerance and impaired gait and balance. Pt is currently at high risk for falls when mobilizing. Pt was able to begin gait training on today with use of a RW. She walked ~7 feet with a recliner closely following behind. Noted increased shaking and anxiety with mobility. Also initiated LE exercises as well on today. Noted improved performance with exercises when distracted by conversation. Encouraged pt to perform exercises, in bed/sitting in recliner, 10 reps, 2x/day. Discussed d/c plan-feel pt may need ST rehab at this time. If she chooses to return home, would recommend HHPT and 24 hour supervision/assist for safety.     Follow Up Recommendations SNF    Equipment Recommendations  Rolling walker with 5" wheels;3in1 (PT)    Recommendations for Other Services       Precautions / Restrictions Precautions Precautions: Fall Restrictions Weight Bearing Restrictions: No      Mobility  Bed Mobility               General bed mobility comments: oob in recliner  Transfers Overall transfer level: Needs assistance Equipment used: Rolling walker (2 wheeled) Transfers: Sit to/from Stand Sit to Stand: Mod assist;+2 physical assistance;+2 safety/equipment         General transfer comment: Assist to rise, stabilize, control descent. VCs safety, technique, hand placement, deep breaths. Increased time to rise. Posterior lean initially. Once standing, pt immediately began shaking. With time and cues to breathe deeply, pt was able to stop shaking and progress to ambulation. Pt tends to sit  abruptly!  Ambulation/Gait Ambulation/Gait assistance: Min assist;+2 physical assistance;+2 safety/equipment Gait Distance (Feet): 7 Feet Assistive device: Rolling walker (2 wheeled) Gait Pattern/deviations: Step-to pattern;Trunk flexed;Decreased dorsiflexion - right;Decreased dorsiflexion - left     General Gait Details: Assist to stabilize pt and manage RW. Again, pt began shaking. With a little time and cues to breathe deeply, pt was able to continue ambulation. Very close following with recliner.   Stairs            Wheelchair Mobility    Modified Rankin (Stroke Patients Only)       Balance Overall balance assessment: Needs assistance         Standing balance support: Bilateral upper extremity supported Standing balance-Leahy Scale: Poor                               Pertinent Vitals/Pain Pain Assessment: No/denies pain    Home Living Family/patient expects to be discharged to:: Private residence Living Arrangements: Children Available Help at Discharge: Family Type of Home: House Home Access: Stairs to enter Entrance Stairs-Rails: Right Entrance Stairs-Number of Steps: 4 Home Layout: One level Home Equipment: None      Prior Function Level of Independence: Independent               Hand Dominance        Extremity/Trunk Assessment   Upper Extremity Assessment Upper Extremity Assessment: Generalized weakness    Lower Extremity Assessment Lower Extremity Assessment: Generalized weakness    Cervical / Trunk Assessment Cervical / Trunk Assessment: Normal  Communication   Communication: No difficulties  Cognition Arousal/Alertness: Awake/alert Behavior During Therapy: Anxious Overall Cognitive Status: Within Functional Limits for tasks assessed                                        General Comments      Exercises General Exercises - Lower Extremity Ankle Circles/Pumps: AROM;Both;15 reps;Seated Quad  Sets: AROM;Both;15 reps;Seated Long Arc Quad: AROM;Both;15 reps;Seated Hip ABduction/ADduction: AAROM;5 reps;Seated;Both Hip Flexion/Marching: AROM;Both;5 reps;Seated   Assessment/Plan    PT Assessment Patient needs continued PT services  PT Problem List Decreased strength;Decreased balance;Decreased mobility;Decreased activity tolerance;Decreased coordination;Decreased knowledge of use of DME       PT Treatment Interventions DME instruction;Gait training;Functional mobility training;Therapeutic activities;Balance training;Patient/family education;Therapeutic exercise    PT Goals (Current goals can be found in the Care Plan section)  Acute Rehab PT Goals Patient Stated Goal: to regain PLOF PT Goal Formulation: With patient/family Time For Goal Achievement: 06/11/18 Potential to Achieve Goals: Good    Frequency Min 3X/week   Barriers to discharge        Co-evaluation               AM-PAC PT "6 Clicks" Mobility  Outcome Measure Help needed turning from your back to your side while in a flat bed without using bedrails?: A Lot Help needed moving from lying on your back to sitting on the side of a flat bed without using bedrails?: A Lot Help needed moving to and from a bed to a chair (including a wheelchair)?: A Lot Help needed standing up from a chair using your arms (e.g., wheelchair or bedside chair)?: A Lot Help needed to walk in hospital room?: A Lot Help needed climbing 3-5 steps with a railing? : Total 6 Click Score: 11    End of Session Equipment Utilized During Treatment: Gait belt Activity Tolerance: Patient limited by fatigue Patient left: in chair;with call bell/phone within reach;with family/visitor present   PT Visit Diagnosis: Unsteadiness on feet (R26.81);Muscle weakness (generalized) (M62.81);Difficulty in walking, not elsewhere classified (R26.2)    Time: 1000-1023 PT Time Calculation (min) (ACUTE ONLY): 23 min   Charges:   PT Evaluation $PT Eval  Moderate Complexity: 1 Mod PT Treatments $Gait Training: 8-22 mins          Weston Anna, PT Acute Rehabilitation Services Pager: [phone] Office: [phone]

## 2018-06-05 LAB — GLUCOSE, CAPILLARY
Glucose-Capillary: 93 mg/dL (ref 70–99)
Glucose-Capillary: 134 mg/dL — ABNORMAL HIGH (ref 70–99)
Glucose-Capillary: 194 mg/dL — ABNORMAL HIGH (ref 70–99)

## 2018-06-05 LAB — CULTURE, RESPIRATORY W GRAM STAIN: Culture: NORMAL

## 2018-06-05 MED ORDER — PREDNISONE 20 MG PO TABS: 20.0000 mg | ORAL_TABLET | Freq: Every day | ORAL | 0 refills | Status: DC

## 2018-06-05 MED ORDER — METOPROLOL SUCCINATE ER 25 MG PO TB24: 25.0000 mg | ORAL_TABLET | Freq: Every day | ORAL | 0 refills | Status: DC

## 2018-06-05 MED ORDER — IPRATROPIUM-ALBUTEROL 0.5-2.5 (3) MG/3ML IN SOLN: 3.0000 mL | Freq: Two times a day (BID) | RESPIRATORY_TRACT | 0 refills | Status: DC

## 2018-06-05 MED ORDER — DIAZEPAM 2 MG PO TABS: 2.0000 mg | ORAL_TABLET | Freq: Two times a day (BID) | ORAL | 0 refills | Status: DC | PRN

## 2018-06-05 MED ORDER — GUAIFENESIN ER 600 MG PO TB12: 600.0000 mg | ORAL_TABLET | Freq: Two times a day (BID) | ORAL | 0 refills | Status: DC

## 2018-06-05 NOTE — Clinical Social Work Placement (Signed)
   CLINICAL SOCIAL WORK PLACEMENT  NOTE  Date:  06/05/2018  Patient Details  Name: Leslie Robinson MRN: [id number] Date of Birth: [birth year]  Clinical Social Work is seeking post-discharge placement for this patient at the [place] level of care (*CSW will initial, date and re-position this form in  chart as items are completed):  Yes   Patient/family provided with [facility] Work Department's list of facilities offering this level of care within the geographic area requested by the patient (or if unable, by the patient's family).  Yes   Patient/family informed of their freedom to choose among providers that offer the needed level of care, that participate in Medicare, Medicaid or managed care program needed by the patient, have an available bed and are willing to accept the patient.  Yes   Patient/family informed of [facility]'s ownership interest in [facility] and [facility], as well as of the fact that they are under no obligation to receive care at these facilities.  PASRR submitted to EDS on       PASRR number received on       Existing PASRR number confirmed on       FL2 transmitted to all facilities in geographic area requested by pt/family on       FL2 transmitted to all facilities within larger geographic area on       Patient informed that his/her managed care company has contracts with or will negotiate with certain facilities, including the following:            Patient/family informed of bed offers received.  Patient chooses bed at [facility]     Physician recommends and patient chooses bed at      Patient to be transferred to [facility] on 06/05/18.  Patient to be transferred to facility by       Patient family notified on 06/05/18 of transfer.  Name of family member notified:  pt family at bedside and is aware of dc     PHYSICIAN       Additional Comment:     _______________________________________________ Wende Neighbors, LCSW 06/05/2018, 2:49 PM

## 2018-06-05 NOTE — Discharge Summary (Addendum)
Physician Discharge Summary  Leslie Robinson PIR:[id number] DOB: 05/12/1976 DOA: 06/01/2018  PCP: Yvone Neu, MD  Admit date: 06/01/2018 Discharge date: 06/05/2018  Admitted From: Home  Disposition:  SNF  Recommendations for Outpatient Follow-up:  1. Follow up with PCP in 1-2 weeks 2. Please obtain BMP/CBC in one week 3. Needs follow up with pulmonologist and neurology.    Home Health: none  Discharge Condition: stable.  CODE STATUS: full code Diet recommendation: Heart Healthy  Brief/Interim Summary: Leslie Robinson is a 43 y.o. female with medical history significant of anxiety, obesity, asthma.  Patient was admitted 1 week ago to [facility] with BUL PNA, symptoms had been going on for several weeks and included fever 102.5, cough productive of brown sputum, SOB, wheezing.  She had failed outpatient ABx with clarithromycin.  Patient presented with Pneumonia and weakness. Her pneumonia and respiratory status has improved.  Weakness has improved, needs rehab.   Needs out patient follow up with pulmonologist and neurology.   Persistent cough/wheezing/bilateral upper lobe pneumonia -not improving with two rounds of outpatient treatment and inpatient treatment at OSH since 1/5 -start nebs/mucinex, wheezing resolved,  -pulm consulted , appreciate input, off abx per pulm recommendation -she is to follow up with pulmonology Dr Loanne Drilling on 2/24, she is to have repeat ct chest prior to 2/24, pulmonology will arrange. - discharge on prednisone taper, and nebulizer.   Acute hypoxic respiratory failure was rule out. ABG no significant hypoxemia.,   Progressive weakness -reports was able to walk at OSH until two days ago, now not able to walk, needs two person assist to get up, it is hard to hold the cup, difficult raise arm above shoulder, denies dysphagia, reports sob but does not appear in respiratory distress -ck unremarkable, less likely steroid induced - Neurology consulted who  does not think this is GBS, recommend address anxiety,  b12 unremarkable, start PT who recommend SNF placement -patient reports she has similar issues in the past when she go through stress/illness, at one time she was wheelchair bound , she is advised to follow up with behavorial health  -consider mri brain, t/l spine if not improving in 3-4 days. Symptoms improving. Needs follow up with neurology out patient.   -Outpatient neurology for chronic complaints of leg weakness,memory problems.  Anemia of chronic disease hgb stable at baseline around 10  Anxiety/depression: Follow up with behavorial health    Discharge Diagnoses:  Principal Problem:   Infiltrate noted on imaging study Active Problems:   Asthma, chronic, unspecified asthma severity, with acute exacerbation   Generalized weakness   Anxiety   Lobar pneumonia [facility])    Discharge Instructions  Discharge Instructions    Diet - low sodium heart healthy   Complete by:  As directed    Increase activity slowly   Complete by:  As directed      Allergies as of 06/05/2018      Reactions   Tape Rash      Medication List    STOP taking these medications   clarithromycin 500 MG tablet Commonly known as:  BIAXIN   HYDROcodone-acetaminophen 5-325 MG tablet Commonly known as:  NORCO/VICODIN   metoprolol tartrate 25 MG tablet Commonly known as:  LOPRESSOR   ondansetron 4 MG tablet Commonly known as:  ZOFRAN   pantoprazole 40 MG tablet Commonly known as:  PROTONIX     TAKE these medications   benzonatate 100 MG capsule Commonly known as:  TESSALON Take 100 mg by mouth as needed  for cough.   buPROPion 100 MG 12 hr tablet Commonly known as:  WELLBUTRIN SR Take 100 mg by mouth daily after lunch.   CAPSAICIN EX Apply 1 application topically 2 (two) times daily. Applied to the chest area   diazepam 2 MG tablet Commonly known as:  VALIUM Take 1 tablet (2 mg total) by mouth every 12 (twelve) hours as needed for  anxiety or muscle spasms.   FLUoxetine 20 MG tablet Commonly known as:  PROZAC Take 20 mg by mouth daily.   guaiFENesin 600 MG 12 hr tablet Commonly known as:  MUCINEX Take 1 tablet (600 mg total) by mouth 2 (two) times daily.   ipratropium-albuterol 0.5-2.5 (3) MG/3ML Soln Commonly known as:  DUONEB Take 3 mLs by nebulization 2 (two) times daily.   LAMICTAL 25 MG Chew chewable tablet Generic drug:  lamoTRIgine Chew 25 mg by mouth every evening.   lisinopril 20 MG tablet Commonly known as:  PRINIVIL,ZESTRIL Take 20 mg by mouth daily.   metoprolol succinate 25 MG 24 hr tablet Commonly known as:  TOPROL-XL Take 1 tablet (25 mg total) by mouth daily. Start taking on:  June 06, 2018   pramipexole 0.75 MG tablet Commonly known as:  MIRAPEX Take 0.125 mg by mouth daily. Reported on 06/09/2015   predniSONE 20 MG tablet Commonly known as:  DELTASONE Take 1 tablet (20 mg total) by mouth daily with breakfast. Start taking on:  June 06, 2018       Contact information for follow-up providers    Margaretha Seeds, MD Follow up on 07/15/2018.   Specialty:  Pulmonary Disease Why:  Appt at [facility].  Please arrive at 1:45 PM for check in. Contact information: [address] [place] [address] [phone]        behavioral health Follow up.   Why:  [facility] in [place] for anxiety /depression managment.       neurology Follow up in 1 month(s).   Why:  Outpatient neurology for chronic complaints of leg weakness, memory problems. consider MRI t/l spine if weakness does not improve.           Contact information for after-discharge care    [facility] .   Service:  Skilled Nursing Contact information: [address] [address] [phone]                 Allergies  Allergen Reactions  . Tape Rash    Consultations: Neurology Pulmonary    Procedures/Studies: Ct Outside Films Chest  Result Date: 06/03/2018 This examination belongs to an outside facility and is stored here for comparison purposes only.  Contact the originating outside institution for any associated report or interpretation.  Dg Outside Films Chest  Result Date: 06/03/2018 This examination belongs to an outside facility and is stored here for comparison purposes only.  Contact the originating outside institution for any associated report or interpretation.  Dg Outside Films Chest  Result Date: 06/03/2018 This examination belongs to an outside facility and is stored here for comparison purposes only.  Contact the originating outside institution for any associated report or interpretation.  Dg Outside Films Chest  Result Date: 06/03/2018 This examination belongs to an outside facility and is stored here for comparison purposes only.  Contact the originating outside institution for any associated report or interpretation.  Dg Outside Films Chest  Result Date: 06/03/2018 This examination belongs to  an outside facility and is stored here for comparison purposes only.  Contact the originating outside institution for any associated report or interpretation.     Subjective: Breathing better, weakness improving.   Discharge Exam: Vitals:   06/05/18 0817 06/05/18 1039  BP:  (!) 101/54  Pulse:  82  Resp:    Temp:    SpO2: 99%    Vitals:   06/04/18 2124 06/05/18 0458 06/05/18 0817 06/05/18 1039  BP: (!) 96/58 121/69  (!) 101/54  Pulse: 77 75  82  Resp: 14 15    Temp: 98.1 F (36.7 C) 98.2 F (36.8 C)    TempSrc: Oral Oral    SpO2: 98% 100% 99%   Weight:      Height:        General: Pt is alert, awake, not in acute distress Cardiovascular: RRR, S1/S2 +, no rubs, no gallops Respiratory: CTA bilaterally, no wheezing, no rhonchi Abdominal: Soft, NT, ND, bowel sounds + Extremities: no edema, no cyanosis    The results of significant  diagnostics from this hospitalization (including imaging, microbiology, ancillary and laboratory) are listed below for reference.     Microbiology: Recent Results (from the past 240 hour(s))  Culture, sputum-assessment     Status: None   Collection Time: 06/01/18  7:03 PM  Result Value Ref Range Status   Specimen Description SPUTUM  Final   Special Requests NONE  Final   Sputum evaluation   Final    THIS SPECIMEN IS ACCEPTABLE FOR SPUTUM CULTURE Performed at [facility], [place] [address] [place], [place] [address]    Report Status 06/02/2018 FINAL  Final  Gram stain     Status: None   Collection Time: 06/01/18  7:03 PM  Result Value Ref Range Status   Specimen Description TRACHEAL SITE  Final   Special Requests NONE  Final   Gram Stain   Final    NO WBC SEEN GRAM POSITIVE COCCI CRITICAL RESULT CALLED TO, READ BACK BY AND VERIFIED WITH: D.MICHAUX,RN [id number] @1804  BY V.WILKINS QUESTIONABLE RESULTS, RECOMMEND RECOLLECT TO VERIFY GRAM STAIN WAS PERFORMED IN ERROR ON URINE, ORDER WILL BE CREDITED, CALLED D.MICHAUX,RN [id number] @1816  BY V.WILKINS Performed at [facility], [place] [address], [place], [place] [address]    Report Status 06/02/2018 FINAL  Final  Culture, respiratory     Status: None   Collection Time: 06/01/18  7:03 PM  Result Value Ref Range Status   Specimen Description   Final    SPUTUM Performed at [place] [address], [place], [place] [address]    Special Requests   Final    NONE Reflexed from [id number] Performed at [facility], [place] [address] [place], [place] [address]    Gram Stain   Final    FEW WBC PRESENT, PREDOMINANTLY PMN FEW GRAM POSITIVE COCCI FEW GRAM POSITIVE RODS FEW GRAM NEGATIVE RODS    Culture   Final    FEW Consistent with normal respiratory flora. Performed at [facility], [place] [address] [place], [place] [address]    Report Status 06/05/2018 FINAL  Final   Culture, blood (routine x 2) Call MD if unable to obtain prior to antibiotics being given     Status: None (Preliminary result)   Collection Time: 06/01/18  8:22 PM  Result Value Ref Range Status   Specimen Description   Final    BLOOD LEFT ANTECUBITAL Performed at [employer]  [address] [place], [place] [address]    Special Requests   Final    BOTTLES DRAWN AEROBIC ONLY Blood Culture adequate volume Performed at [place] [address], [place], [place] [address]    Culture   Final    NO GROWTH 4 DAYS Performed at [facility], [place] [address] [place], [place] [address]    Report Status PENDING  Incomplete  Culture, blood (routine x 2) Call MD if unable to obtain prior to antibiotics being given     Status: None (Preliminary result)   Collection Time: 06/01/18 10:26 PM  Result Value Ref Range Status   Specimen Description   Final    BLOOD RIGHT ANTECUBITAL Performed at [facility] [address] [place], [place] [address]    Special Requests   Final    BOTTLES DRAWN AEROBIC ONLY Blood Culture adequate volume Performed at [facility] [address], [place], [place] [address]    Culture   Final    NO GROWTH 4 DAYS Performed at [facility], [place] [address] [place], [place] [address]    Report Status PENDING  Incomplete  MRSA PCR Screening     Status: None   Collection Time: 06/02/18  9:48 AM  Result Value Ref Range Status   MRSA by PCR NEGATIVE NEGATIVE Final    Comment:        The GeneXpert MRSA Assay (FDA approved for NASAL specimens only), is one component of a comprehensive MRSA colonization surveillance program. It is not intended to diagnose MRSA infection nor to guide or monitor treatment for MRSA infections. Performed at [facility], [place] [address], [place], [place] [address]      Labs: BNP (last 3 results) No results for input(s):  BNP in the last 8760 hours. Basic Metabolic Panel: Recent Labs  Lab 06/01/18 2022 06/02/18 0708 06/03/18 0801 06/04/18 0524  NA 136 138 136 139  K 4.9 3.8 3.9 4.1  CL 101 101 101 103  CO2 22 25 25 23   GLUCOSE 120* 93 102* 100*  BUN 16 18 13 17   CREATININE 0.82 0.75 0.72 0.84  CALCIUM 9.2 9.2 9.3 9.7  MG 2.4  --  2.2  --    Liver Function Tests: No results for input(s): AST, ALT, ALKPHOS, BILITOT, PROT, ALBUMIN in the last 168 hours. No results for input(s): LIPASE, AMYLASE in the last 168 hours. No results for input(s): AMMONIA in the last 168 hours. CBC: Recent Labs  Lab 06/01/18 2022 06/03/18 0801  WBC 6.8 7.2  NEUTROABS  --  4.7  HGB 10.5* 11.0*  HCT 33.2* 34.9*  MCV 99.4 99.4  PLT 314 351   Cardiac Enzymes: Recent Labs  Lab 06/02/18 1056  CKTOTAL 63   BNP: Invalid input(s): POCBNP CBG: Recent Labs  Lab 06/04/18 1153 06/04/18 1625 06/04/18 2127 06/05/18 0757 06/05/18 1142  GLUCAP 185* 156* 131* 93 134*   D-Dimer No results for input(s): DDIMER in the last 72 hours. Hgb A1c No results for input(s): HGBA1C in the last 72 hours. Lipid Profile No results for input(s): CHOL, HDL, LDLCALC, TRIG, CHOLHDL, LDLDIRECT in the last 72 hours. Thyroid function studies No results for input(s): TSH, T4TOTAL, T3FREE, THYROIDAB in the last 72 hours.  Invalid input(s): FREET3 Anemia work up Recent Labs    06/04/18 0524  VITAMINB12 246   Urinalysis    Component Value Date/Time   COLORURINE YELLOW 12/02/2015 [place]  CLEAR 12/02/2015 0940   LABSPEC 1.015 12/02/2015 0940   PHURINE 6.0 12/02/2015 0940   GLUCOSEU NEGATIVE 12/02/2015 0940   HGBUR NEGATIVE 12/02/2015 0940   BILIRUBINUR NEGATIVE 12/02/2015 0940   KETONESUR NEGATIVE 12/02/2015 0940   PROTEINUR NEGATIVE 12/02/2015 0940   NITRITE NEGATIVE 12/02/2015 0940   LEUKOCYTESUR NEGATIVE 12/02/2015 0940   Sepsis Labs Invalid input(s): PROCALCITONIN,  WBC,  LACTICIDVEN Microbiology Recent  Results (from the past 240 hour(s))  Culture, sputum-assessment     Status: None   Collection Time: 06/01/18  7:03 PM  Result Value Ref Range Status   Specimen Description SPUTUM  Final   Special Requests NONE  Final   Sputum evaluation   Final    THIS SPECIMEN IS ACCEPTABLE FOR SPUTUM CULTURE Performed at [facility], [place] [address], [place], [place] [address]    Report Status 06/02/2018 FINAL  Final  Gram stain     Status: None   Collection Time: 06/01/18  7:03 PM  Result Value Ref Range Status   Specimen Description TRACHEAL SITE  Final   Special Requests NONE  Final   Gram Stain   Final    NO WBC SEEN GRAM POSITIVE COCCI CRITICAL RESULT CALLED TO, READ BACK BY AND VERIFIED WITH: D.MICHAUX,RN [id number] @1804  BY V.WILKINS QUESTIONABLE RESULTS, RECOMMEND RECOLLECT TO VERIFY GRAM STAIN WAS PERFORMED IN ERROR ON URINE, ORDER WILL BE CREDITED, CALLED D.MICHAUX,RN [id number] @1816  BY V.WILKINS Performed at [facility], [place] [address] [place], [place] [address]    Report Status 06/02/2018 FINAL  Final  Culture, respiratory     Status: None   Collection Time: 06/01/18  7:03 PM  Result Value Ref Range Status   Specimen Description   Final    SPUTUM Performed at [facility] [address], [place], [place] [address]    Special Requests   Final    NONE Reflexed from [id number] Performed at [facility], [place] [address] [place], [place] [address]    Gram Stain   Final    FEW WBC PRESENT, PREDOMINANTLY PMN FEW GRAM POSITIVE COCCI FEW GRAM POSITIVE RODS FEW GRAM NEGATIVE RODS    Culture   Final    FEW Consistent with normal respiratory flora. Performed at [facility], [place] [address], [place], [place] [address]    Report Status 06/05/2018 FINAL  Final  Culture, blood (routine x 2) Call MD if unable to obtain prior to antibiotics being given     Status: None (Preliminary result)   Collection Time: 06/01/18   8:22 PM  Result Value Ref Range Status   Specimen Description   Final    BLOOD LEFT ANTECUBITAL Performed at [facility] [address] [place], [place] [address]    Special Requests   Final    BOTTLES DRAWN AEROBIC ONLY Blood Culture adequate volume Performed at [facility] [address], [place], [place] [address]    Culture   Final    NO GROWTH 4 DAYS Performed at [facility], [place] [address], [place],  [address]    Report Status PENDING  Incomplete  Culture, blood (routine x 2) Call MD if unable to obtain prior to antibiotics being given     Status: None (Preliminary result)   Collection Time: 06/01/18 10:26 PM  Result Value Ref Range Status   Specimen Description   Final    BLOOD RIGHT ANTECUBITAL Performed at [facility], [address] [place], [place] [address]    Special Requests   Final    BOTTLES DRAWN AEROBIC ONLY Blood Culture adequate volume Performed at [place] [address] [place], [place] [address]    Culture   Final    NO GROWTH 4 DAYS Performed at [facility], [place] [address], [place],  [address]    Report Status PENDING  Incomplete  MRSA PCR Screening     Status: None   Collection Time: 06/02/18  9:48 AM  Result Value Ref Range Status   MRSA by PCR NEGATIVE NEGATIVE Final    Comment:        The GeneXpert MRSA Assay (FDA approved for NASAL specimens only), is one component of a comprehensive MRSA colonization surveillance program. It is not intended to diagnose MRSA infection nor to guide or monitor treatment for MRSA infections. Performed at [facility], [place] [address], [place],  [address]      Time coordinating discharge:35 minutes  SIGNED:   Elmarie Shiley, MD  [facility] 06/05/2018, 1:32 PM Pager   If 7PM-7AM, please contact night-coverage [email or web address] Password [email or web address]

## 2018-06-05 NOTE — Progress Notes (Signed)
Report called to [facility] in [place].

## 2018-06-05 NOTE — Progress Notes (Signed)
Clinical Social Worker facilitated patient discharge including contacting patient family and facility to confirm patient discharge plans.  Clinical information faxed to facility and family agreeable with plan.  CSW arranged ambulance transport via [facility] to [place] .  RN to call [phone] (pt will go in rm# [phone]) for report prior to discharge.  Clinical Social Worker will sign off for now as social work intervention is no longer needed. Please consult [place] again if new need arises.  Rhea Pink, MSW, [place]

## 2018-06-05 NOTE — Progress Notes (Signed)
NIF -30 FVC 2.4L great pt effort.

## 2018-06-05 NOTE — Evaluation (Signed)
Occupational Therapy Evaluation Patient Details Name: Leslie Robinson MRN: [id number] DOB: 11-27-1975 Today's Date: 06/05/2018    History of Present Illness 43 yo female admitted with Pna, weakness, inability to ambulate. hx of obesity, anxiety, RLS   Clinical Impression   Pt admitted with PNA and weakness. Pt currently with functional limitations due to the deficits listed below (see OT Problem List).  Pt will benefit from skilled OT to increase their safety and independence with ADL and functional mobility for ADL to facilitate discharge to venue listed below. Pt will need to increase I prior to returning home.       Follow Up Recommendations  SNF    Equipment Recommendations  None recommended by OT    Recommendations for Other Services       Precautions / Restrictions Precautions Precautions: Fall Restrictions Weight Bearing Restrictions: No      Mobility Bed Mobility               General bed mobility comments: oob in recliner  Transfers Overall transfer level: Needs assistance Equipment used: Rolling walker (2 wheeled) Transfers: Sit to/from Stand Sit to Stand: Min assist         General transfer comment: Assist to rise, stabilize, control descent. VCs safety, technique, hand placement, deep breaths. Increased time to rise. Posterior lean initially. Once standing, pt immediately began shaking. With time and cues to breathe deeply, pt was able to stop shaking and progress to ambulation.     Balance Overall balance assessment: Needs assistance         Standing balance support: Bilateral upper extremity supported Standing balance-Leahy Scale: Poor                             ADL either performed or assessed with clinical judgement   ADL Overall ADL's : Needs assistance/impaired Eating/Feeding: Set up;Sitting   Grooming: Set up;Sitting   Upper Body Bathing: Set up;Sitting   Lower Body Bathing: Maximal assistance;Sit to/from stand;Cueing  for sequencing;Cueing for safety   Upper Body Dressing : Minimal assistance;Sitting   Lower Body Dressing: Maximal assistance;Sit to/from stand;Cueing for sequencing;Cueing for safety   Toilet Transfer: Moderate assistance;BSC;RW;Stand-pivot   Toileting- Clothing Manipulation and Hygiene: Maximal assistance;Sit to/from stand;Cueing for sequencing;Cueing for safety         General ADL Comments: pt will need to regain I prior to going home- will need post acute rehab.  Pt is a widow and has a 43 year old     Vision Patient Visual Report: No change from baseline              Pertinent Vitals/Pain Pain Assessment: No/denies pain     Hand Dominance     Extremity/Trunk Assessment         Cervical / Trunk Assessment Cervical / Trunk Assessment: Normal   Communication Communication Communication: No difficulties   Cognition Arousal/Alertness: Awake/alert Behavior During Therapy: Anxious Overall Cognitive Status: Within Functional Limits for tasks assessed                                                Home Living Family/patient expects to be discharged to:: Private residence Living Arrangements: Children Available Help at Discharge: Family Type of Home: House Home Access: Stairs to enter Technical brewer of Steps: 4 Entrance Stairs-Rails: Right Home  Layout: One level               Home Equipment: None          Prior Functioning/Environment Level of Independence: Independent                 OT Problem List: Decreased strength;Impaired balance (sitting and/or standing);Decreased activity tolerance;Decreased safety awareness;Decreased knowledge of use of DME or AE;Cardiopulmonary status limiting activity;Obesity      OT Treatment/Interventions: Patient/family education;Therapeutic activities;DME and/or AE instruction;Self-care/ADL training    OT Goals(Current goals can be found in the care plan section) Acute Rehab OT  Goals Patient Stated Goal: to regain PLOF OT Goal Formulation: With patient Time For Goal Achievement: 06/19/18 Potential to Achieve Goals: Good  OT Frequency: Min 2X/week   Barriers to Robinson/C: Decreased caregiver support             AM-PAC OT "6 Clicks" Daily Activity     Outcome Measure Help from another person eating meals?: None Help from another person taking care of personal grooming?: A Little Help from another person toileting, which includes using toliet, bedpan, or urinal?: A Lot Help from another person bathing (including washing, rinsing, drying)?: A Lot Help from another person to put on and taking off regular upper body clothing?: A Little Help from another person to put on and taking off regular lower body clothing?: A Lot 6 Click Score: 16   End of Session Equipment Utilized During Treatment: Rolling walker Nurse Communication: Mobility status  Activity Tolerance: Patient tolerated treatment well Patient left: in chair;with call bell/phone within reach;with family/visitor present  OT Visit Diagnosis: Unsteadiness on feet (R26.81);Other abnormalities of gait and mobility (R26.89);Muscle weakness (generalized) (M62.81)                Time: 1445-1510 OT Time Calculation (min): 25 min Charges:  OT General Charges $OT Visit: 1 Visit OT Evaluation $OT Eval Moderate Complexity: 1 Mod OT Treatments $Self Care/Home Management : 8-22 mins  Leslie Robinson, OT Acute Rehabilitation Services [phone] Office- [phone], Leslie Robinson 06/05/2018, 3:53 PM

## 2018-06-05 NOTE — NC FL2 (Signed)
[place] LEVEL OF CARE SCREENING TOOL     IDENTIFICATION  Patient Name: Leslie Robinson Birthdate: 1975/07/13 Sex: female Admission Date (Current Location): 06/01/2018  [facility] and [place] Number:  Herbalist and Address:  [facility],  [place] [place]      Provider Number: [id number]  Attending Physician Name and Address:  Elmarie Shiley, MD  Relative Name and Phone Number:       Current Level of Care: Hospital Recommended Level of Care: [place] Prior Approval Number:    Date Approved/Denied:   PASRR Number: [id number]  Discharge Plan: SNF    Current Diagnoses: Patient Active Problem List   Diagnosis Date Noted  . Lobar pneumonia ([place])   . Infiltrate noted on imaging study 06/01/2018  . Acute respiratory failure with hypoxia ([place]) 06/01/2018  . Asthma, chronic, unspecified asthma severity, with acute exacerbation 06/01/2018  . Generalized weakness 06/01/2018  . Anxiety 06/01/2018  . Pulmonary edema, acute ([place]) 02/14/2016    Orientation RESPIRATION BLADDER Height & Weight     Self, Time, Situation, Place  Normal Continent Weight: 228 lb 13.4 oz (103.8 kg) Height:  5\' 3"  (160 cm)  BEHAVIORAL SYMPTOMS/MOOD NEUROLOGICAL BOWEL NUTRITION STATUS      Continent Diet(carb modified)  AMBULATORY STATUS COMMUNICATION OF NEEDS Skin   Limited Assist Verbally Normal                       Personal Care Assistance Level of Assistance  Bathing, Feeding, Dressing Bathing Assistance: Limited assistance Feeding assistance: Independent Dressing Assistance: Limited assistance     Functional Limitations Info  Sight, Hearing, Speech Sight Info: Adequate Hearing Info: Adequate Speech Info: Adequate    SPECIAL CARE FACTORS FREQUENCY  PT (By licensed PT), OT (By licensed OT)     PT Frequency: 5x wk OT Frequency: 5x wk            Contractures Contractures Info: Not present    Additional  Factors Info  Code Status, Allergies Code Status Info: Full Code Allergies Info: Tape           Current Medications (06/05/2018):  This is the current hospital active medication list Current Facility-Administered Medications  Medication Dose Route Frequency Provider Last Rate Last Dose  . acetaminophen (TYLENOL) tablet 650 mg  650 mg Oral Q6H PRN Lovey Newcomer T, NP   650 mg at 06/03/18 1452  . albuterol (PROVENTIL) (2.5 MG/3ML) 0.083% nebulizer solution 2.5 mg  2.5 mg Nebulization Q2H PRN Etta Quill, DO   2.5 mg at 06/02/18 0913  . arformoterol (BROVANA) nebulizer solution 15 mcg  15 mcg Nebulization BID Mannam, Praveen, MD   15 mcg at 06/05/18 0817  . benzonatate (TESSALON) capsule 100 mg  100 mg Oral TID PRN Etta Quill, DO      . budesonide (PULMICORT) nebulizer solution 0.5 mg  0.5 mg Nebulization BID Mannam, Praveen, MD   0.5 mg at 06/05/18 0814  . buPROPion [facility] SR) 12 hr tablet 100 mg  100 mg Oral QAC lunch Jennette Kettle M, DO   100 mg at 06/05/18 1146  . diazepam (VALIUM) tablet 2 mg  2 mg Oral Q12H PRN Florencia Reasons, MD   2 mg at 06/04/18 2109  . enoxaparin (LOVENOX) injection 40 mg  40 mg Subcutaneous Q24H Jennette Kettle M, DO   40 mg at 06/04/18 2109  . FLUoxetine (PROZAC) capsule 20 mg  20 mg Oral  QHS Etta Quill, DO   20 mg at 06/04/18 2109  . guaiFENesin (MUCINEX) 12 hr tablet 600 mg  600 mg Oral BID Florencia Reasons, MD   600 mg at 06/05/18 1039  . insulin aspart (novoLOG) injection 0-15 Units  0-15 Units Subcutaneous TID WC Etta Quill, DO   2 Units at 06/05/18 1147  . ipratropium-albuterol (DUONEB) 0.5-2.5 (3) MG/3ML nebulizer solution 3 mL  3 mL Nebulization BID Florencia Reasons, MD   3 mL at 06/05/18 0814  . lamoTRIgine (LAMICTAL) tablet 25 mg  25 mg Oral QHS Jennette Kettle M, DO   25 mg at 06/04/18 2109  . lisinopril (PRINIVIL,ZESTRIL) tablet 20 mg  20 mg Oral Daily Jennette Kettle M, DO   20 mg at 06/05/18 1039  . metoprolol succinate (TOPROL-XL) 24 hr tablet 25  mg  25 mg Oral Daily Jennette Kettle M, DO   25 mg at 06/05/18 1039  . ondansetron (ZOFRAN) injection 4 mg  4 mg Intravenous Q6H PRN Etta Quill, DO   4 mg at 06/01/18 2146  . pramipexole (MIRAPEX) tablet 0.75 mg  0.75 mg Oral QHS Jennette Kettle M, DO   0.75 mg at 06/04/18 2109  . predniSONE (DELTASONE) tablet 20 mg  20 mg Oral Q breakfast Ollis, Brandi L, NP   20 mg at 06/05/18 0803     Discharge Medications: Please see discharge summary for a list of discharge medications.  Relevant Imaging Results:  Relevant Lab Results:   Additional Information SS# [id number]  Wende Neighbors, LCSW

## 2018-06-05 NOTE — Progress Notes (Signed)
[facility] arrived to pick up patient, AVS and discharge packet were given.

## 2018-06-06 LAB — CULTURE, BLOOD (ROUTINE X 2)
Culture: NO GROWTH
Culture: NO GROWTH
Special Requests: ADEQUATE
Special Requests: ADEQUATE

## 2018-06-14 ENCOUNTER — Other Ambulatory Visit: Payer: Self-pay

## 2018-06-14 DIAGNOSIS — R0602 Shortness of breath: Secondary | ICD-10-CM

## 2018-06-19 ENCOUNTER — Telehealth: Payer: Self-pay | Admitting: Pulmonary Disease

## 2018-06-19 NOTE — Telephone Encounter (Signed)
I called Central Scheduling and canceled CT that I had scheduled at [facility]  Tried to call [facility] to see if pt has been scheduled.  On hold 5 minutes.  Will try back in the morning.

## 2018-06-20 NOTE — Telephone Encounter (Signed)
Libby checked & precert has been switched to [facility].  I called them & spoke to [facility].  Sched pt for 2/5 at 9:45.  Called pt & gave her appt info.  Faxed order to DD.  Nothing further needed.

## 2018-06-26 ENCOUNTER — Ambulatory Visit (HOSPITAL_COMMUNITY): Payer: Self-pay | Admitting: Psychiatry

## 2018-06-27 ENCOUNTER — Ambulatory Visit (INDEPENDENT_AMBULATORY_CARE_PROVIDER_SITE_OTHER): Payer: 59 | Admitting: Psychiatry

## 2018-06-27 ENCOUNTER — Encounter (HOSPITAL_COMMUNITY): Payer: Self-pay | Admitting: Psychiatry

## 2018-06-27 VITALS — BP 128/74 | HR 72 | Ht 63.0 in | Wt 229.0 lb

## 2018-06-27 DIAGNOSIS — F330 Major depressive disorder, recurrent, mild: Secondary | ICD-10-CM | POA: Insufficient documentation

## 2018-06-27 DIAGNOSIS — F411 Generalized anxiety disorder: Secondary | ICD-10-CM | POA: Diagnosis not present

## 2018-06-27 DIAGNOSIS — F331 Major depressive disorder, recurrent, moderate: Secondary | ICD-10-CM | POA: Diagnosis not present

## 2018-06-27 DIAGNOSIS — G2581 Restless legs syndrome: Secondary | ICD-10-CM

## 2018-06-27 MED ORDER — BUPROPION HCL ER (SR) 100 MG PO TB12: 100.0000 mg | ORAL_TABLET | Freq: Two times a day (BID) | ORAL | 0 refills | Status: DC

## 2018-06-27 MED ORDER — FLUOXETINE HCL 20 MG PO TABS: 40.0000 mg | ORAL_TABLET | Freq: Every day | ORAL | 0 refills | Status: DC

## 2018-06-27 MED ORDER — DIAZEPAM 2 MG PO TABS: 2.0000 mg | ORAL_TABLET | Freq: Two times a day (BID) | ORAL | 0 refills | Status: DC | PRN

## 2018-06-27 MED ORDER — TRAZODONE HCL 50 MG PO TABS: 50.0000 mg | ORAL_TABLET | Freq: Every evening | ORAL | 0 refills | Status: DC | PRN

## 2018-06-27 MED ORDER — PRAMIPEXOLE DIHYDROCHLORIDE 0.25 MG PO TABS: 0.2500 mg | ORAL_TABLET | Freq: Every day | ORAL | 1 refills | Status: DC

## 2018-06-27 NOTE — Progress Notes (Signed)
Psychiatric Initial Adult Assessment   Patient Identification: Anwita Mencer MRN:  [id number] Date of Evaluation:  06/27/2018 Referral Source: Sabino Niemann MD Chief Complaint:  Anxiety, depression, insomnia Visit Diagnosis:    ICD-10-CM   1. Major depressive disorder, recurrent episode, moderate (HCC) F33.1   2. GAD (generalized anxiety disorder) F41.1   3. RLS (restless legs syndrome) G25.81     History of Present Illness:  43 yo widowed female who has been recently hospitalized for community acquired pneumonia (she reports that there is a suspicion that she may have also suffered from mild CVA and will have brain MRI done). She has been struggling with depression and anxiety (excessive worrying, occasional panic attacks) since loss of her father  In 2003. She also lost her husband to ALS 16 months ago and is still grieving. She reports depressed mood, excessive worrying, panic attacks, middle insomnia, racing thoughts, poor concentration, some confusion, memory problems, low energy. She denies ever being psychiatrically hospitalized, having suicidal thoughts or attempts. She has no hx of mania or psychosis, no alcohol or substance abuse. Her mother has struggled with clinical depression for many years and was on Prozac and Seroquel.  Celisse has tried few different antidepressants (Zoloft, Celexa, Lexapro, Cymbalta) as well as clonazepam for anxiety which she found too sedating. For the past 10 months or so she has been on 20 mg of fluoxetine, 100 mg of SR bupropion and 25 mg of lamotrigine. While in the hospital she was given 2 mg of diazepam as needed for anxiety with god effect.  Associated Signs/Symptoms: Depression Symptoms:  depressed mood, difficulty concentrating, impaired memory, anxiety, panic attacks, loss of energy/fatigue, disturbed sleep, (Hypo) Manic Symptoms:  None Anxiety Symptoms:  Excessive Worry, Panic Symptoms, Psychotic Symptoms:  None PTSD  Symptoms: Negative  Past Psychiatric History: see above  Previous Psychotropic Medications: Yes   Substance Abuse History in the last 12 months:  No.  Consequences of Substance Abuse: NA  Past Medical History:  Past Medical History:  Diagnosis Date  . Anemia   . Anxiety   . Asthma    childhood  . Fatty liver   . Gallstones   . Gastric ulcer   . GERD (gastroesophageal reflux disease)   . Gestational diabetes   . Heart murmur    "nothing to woorry about"  . Hiatal hernia   . Iron deficiency anemia   . Pneumonia 2017  . Restless leg   . Shingles   . Shortness of breath dyspnea    with exertion  . UTI (lower urinary tract infection)    frequently  . Vitamin D deficiency     Past Surgical History:  Procedure Laterality Date  . CHOLECYSTECTOMY N/A 02/14/2016   Procedure: LAPAROSCOPIC CHOLECYSTECTOMY;  Surgeon: Coralie Keens, MD;  Location: [facility];  Service: General;  Laterality: N/A;  . CHOLECYSTECTOMY  02/14/2016  . TUBAL LIGATION  11-27-2014  . WISDOM TOOTH EXTRACTION      Family Psychiatric History: see below.  Family History:  Family History  Problem Relation Age of Onset  . Hypertension Mother   . Colon polyps Mother   . Diabetes Mother   . Heart disease Mother   . Depression Mother   . Colon cancer Maternal Grandmother   . ADD / ADHD Daughter   . Asperger's syndrome Daughter     Social History:   Social History   Socioeconomic History  . Marital status: Married    Spouse name: Not on file  . Number of  children: 3  . Years of education: Not on file  . Highest education level: Not on file  Occupational History  . Not on file  Social Needs  . Financial resource strain: Not on file  . Food insecurity:    Worry: Not on file    Inability: Not on file  . Transportation needs:    Medical: Not on file    Non-medical: Not on file  Tobacco Use  . Smoking status: Never Smoker  . Smokeless tobacco: Never Used  Substance and Sexual Activity  .  Alcohol use: No    Alcohol/week: 0.0 standard drinks  . Drug use: No  . Sexual activity: Not on file  Lifestyle  . Physical activity:    Days per week: Not on file    Minutes per session: Not on file  . Stress: Not on file  Relationships  . Social connections:    Talks on phone: Not on file    Gets together: Not on file    Attends religious service: Not on file    Active member of club or organization: Not on file    Attends meetings of clubs or organizations: Not on file    Relationship status: Not on file  Other Topics Concern  . Not on file  Social History Narrative  . Not on file    Additional Social History: Widow, work as a Editor, commissioning. Lives with three children (30, 62 yo daughters and 35 yo son).  Allergies:   Allergies  Allergen Reactions  . Tape Rash    Metabolic Disorder Labs: No results found for: HGBA1C, MPG No results found for: PROLACTIN No results found for: CHOL, TRIG, HDL, CHOLHDL, VLDL, LDLCALC Lab Results  Component Value Date   TSH 1.038 06/01/2018    Therapeutic Level Labs: No results found for: LITHIUM No results found for: CBMZ No results found for: VALPROATE  Current Medications: Current Outpatient Medications  Medication Sig Dispense Refill  . buPROPion (WELLBUTRIN SR) 100 MG 12 hr tablet Take 1 tablet (100 mg total) by mouth 2 (two) times daily for 30 days. 60 tablet 0  . FLUoxetine (PROZAC) 20 MG tablet Take 2 tablets (40 mg total) by mouth daily for 30 days. 60 tablet 0  . lisinopril (PRINIVIL,ZESTRIL) 20 MG tablet Take 20 mg by mouth daily.    . pramipexole (MIRAPEX) 0.25 MG tablet Take 1 tablet (0.25 mg total) by mouth at bedtime for 30 days. Reported on 06/09/2015 30 tablet 1  . diazepam (VALIUM) 2 MG tablet Take 1 tablet (2 mg total) by mouth every 12 (twelve) hours as needed for up to 30 days for anxiety. 30 tablet 0  . traZODone (DESYREL) 50 MG tablet Take 1 tablet (50 mg total) by mouth at bedtime as needed for up to  30 days for sleep. 30 tablet 0   No current facility-administered medications for this visit.     Musculoskeletal: Strength & Muscle Tone: within normal limits Gait & Station: ises  a walker Patient leans: N/A  Psychiatric Specialty Exam: Review of Systems  Constitutional: Negative.   HENT: Negative.   Eyes: Negative.   Respiratory: Negative.   Cardiovascular: Negative.   Gastrointestinal: Negative.   Genitourinary: Negative.   Musculoskeletal: Negative.   Skin: Negative.   Neurological: Positive for weakness.  Endo/Heme/Allergies: Negative.   Psychiatric/Behavioral: Positive for depression and memory loss. The patient is nervous/anxious and has insomnia.     Blood pressure 128/74, pulse 72, height 5\' 3"  (1.6  m), weight 229 lb (103.9 kg).Body mass index is 40.57 kg/m.  General Appearance: Well Groomed  Eye Contact:  Good  Speech:  Clear and Coherent  Volume:  Normal  Mood:  Anxious and Depressed  Affect:  Non-Congruent and Full Range  Thought Process:  Goal Directed  Orientation:  Full (Time, Place, and Person)  Thought Content:  Illogical  Suicidal Thoughts:  No  Homicidal Thoughts:  No  Memory:  Immediate;   Good Recent;   Good Remote;   Good  Judgement:  Good  Insight:  Good  Psychomotor Activity:  Wide Base  Concentration:  Concentration: Fair and Attention Span: Fair  Recall:  Good  Fund of Knowledge:Good  Language: Good  Akathisia:  Negative  Handed:  Right  AIMS (if indicated):  not done  Assets:  Communication Skills Desire for Improvement Housing Social Support Vocational/Educational  ADL's:  Intact  Cognition: WNL  Sleep:  Fair   Screenings:   Assessment and Plan: 43 yo widowed female who has been recently hospitalized for community acquired pneumonia. She has been struggling with depression and anxiety (excessive worrying, occasional panic attacks) since loss of her father  In 2003. She also lost her husband to ALS 16 months ago and is still  grieving. She reports depressed mood, excessive worrying, panic attacks, middle insomnia, racing thoughts, poor concentration, some confusion, memory problems, low energy. She denies ever being psychiatrically hospitalized, having suicidal thoughts or attempts. Multiple medciation trials. Currently taken meds have not been adjusted in months (including Mirapex which she takes for RLS and still has symptoms).  Diagnostic impression:  MDD recurrent moderate; GAD; RLS   Plan: 1. Stop Lamictal.  2. Increase Wellbutrin SR 100 mg to twice daily: 1 in AM one around 2-3 PM.  3. Prozac will be increased to 40 mg daily.  4. Trial of trazodone 50 mg at bedtime for insomnia.  5. We will resume diazepam 2 mg twice daily as needed for anxiety attacks.  6. Dose of Mirapex has been doubled (for RLS). The plan was discussed with patient. I spend 60 minutes in direct face to face clinical contact with the patient and devoted approximately 50% of this time to explanation of diagnosis, discussion of treatment options and med education. Patient will return to clinic in 4 weeks.   Stephanie Acre, MD 2/6/20208:47 AM

## 2018-06-27 NOTE — Patient Instructions (Addendum)
Plan:  1. Stop Lamictal.  2. Increase Wellbutrin SR 100 mg to twice daily: 1 in AM one around 2-3 PM.  3. Prozac will be increased to 40 mg daily.  4. Trial of trazodone 50 mg at bedtime for insomnia. Try different doses from 25 up to 100 mg to find the effective one.  5. We are resuming diazepam 2 mg twice daily as needed for anxiety attacks.  6. Dose of Mirapex has been doubled (for RLS).

## 2018-07-05 ENCOUNTER — Ambulatory Visit (HOSPITAL_COMMUNITY): Payer: Self-pay | Admitting: Psychiatry

## 2018-07-09 ENCOUNTER — Other Ambulatory Visit (HOSPITAL_COMMUNITY): Payer: Self-pay

## 2018-07-15 ENCOUNTER — Inpatient Hospital Stay: Payer: Managed Care, Other (non HMO) | Admitting: Pulmonary Disease

## 2018-07-22 ENCOUNTER — Encounter: Payer: Self-pay | Admitting: Neurology

## 2018-07-22 ENCOUNTER — Ambulatory Visit: Payer: Managed Care, Other (non HMO) | Admitting: Neurology

## 2018-07-22 VITALS — BP 139/82 | HR 76 | Ht 63.0 in | Wt 229.0 lb

## 2018-07-22 DIAGNOSIS — R531 Weakness: Secondary | ICD-10-CM | POA: Diagnosis not present

## 2018-07-22 DIAGNOSIS — G3281 Cerebellar ataxia in diseases classified elsewhere: Secondary | ICD-10-CM | POA: Diagnosis not present

## 2018-07-22 DIAGNOSIS — E538 Deficiency of other specified B group vitamins: Secondary | ICD-10-CM

## 2018-07-22 DIAGNOSIS — R200 Anesthesia of skin: Secondary | ICD-10-CM

## 2018-07-22 DIAGNOSIS — R270 Ataxia, unspecified: Secondary | ICD-10-CM

## 2018-07-22 NOTE — Progress Notes (Signed)
[facility]    Provider:  Dr Jaynee Eagles Referring Provider: Yvone Neu,* Primary Care Provider:  Yvone Neu, MD  CC:  Decreased memory, decreased right-sided strength/coordination  HPI:  Leslie Robinson is a 43 y.o. female here as requested by provider Hungarland, Jenetta Downer,* for memory loss, possible decreased right-sided strength. PMHx major depressive disorder, anemia, hypertension, abnormal glucose, restless leg syndrome, depression and fatigue.  Per notes of her primary care, her gait is ataxic and unsteady, weakness on the right.  Cranial nerves intact including reflexes gait and coordination are all intact. Acutely lost strength in all muscles, she couldn;t even feed herself, her tongue had a web, here with her cousin who also provides information. The right side was more dominant. She has a hard time walking, feelis like there eis something on the right side of the face and up the back of the neck she feels heavy and tingling. She was doing well with the walker and yesterday at church she took 2 steps and felt like she was going to pass out, vision goes blurry. During the hospital stay this occurred.   Reviewed notes, labs and imaging from outside physicians, which showed:  Reviewed notes from Dr. hunger land.  Labs drawn June 24, 2018 include CBC with anemia but stable, CMP with BUN 11 and creatinine 0.66, ANA negative, no Lamictal detected in the serum, TSH normal.  She has generalized weakness, feeling of severe generalized weakness for 3 weeks, fluctuating, difficult to maintain sleep and she often wakes after only 2 to 3 hours, she averages 5 to 6 hours of sleep per night.  She denies depression.  Associated symptoms include arthralgias, confusion, muscle weakness, night sweats, restless legs and weight gain of 25 pounds.  She denies depression, anxiety.  Current affective symptoms include anhedonia, increased appetite, insomnia, decreased ability  to concentrate and tendency toward indecisiveness.  She notes recent stressors including recent hospitalization and severe illness.   Reviewed notes from hematology through care everywhere, patient had anemia secondary to chronic blood loss likely via GI bleed and dysfunctional uterine bleeding, they ordered CBC, CMP, ferritin, B12, folate, haptoglobin, SPEP and other labs to evaluate.  By history the patient may have had thromboembolic events.  They are checking for hypercoagulable state evaluation with additional testing such as factor V Leiden, prothrombin gene, beta-2 glycoprotein, anticardiolipin antibody, ANA with reflex and multiple others for hypercoagulable disorder.  She was referred to cardiology for consideration of echocardiogram and cardiac event monitoring.  January 5 to January 11 patient was admitted to [facility] rocking him with community-acquired pneumonia.  She states that during the course of admission she developed inability to walk or use her hands.  Could not hold silverware, write her name.  Had bouts of chest discomfort, pressure, palpitations that would last 3 to 15 minutes.  Pain also radiated down her arm.  Short-term memory has been affected.  Has expressive aphasia.  Was discharged to [facility].  Currently receiving PT.  Has not undergone MRI, cardiac echo or Holter monitor.  She cared for her husband with ALS.  She is widowed.  MRI brain reviewed report unremarkable  Review of Systems: Patient complains of symptoms per HPI as well as the following symptoms: Fatigue, blurred vision, shortness of breath, anemia, memory loss, confusion, headache, weakness, restless legs, passing out, tremor, depression, anxiety, decreased energy, disinterest in activities, racing thoughts, frequent infections. Pertinent negatives and positives per HPI. All others negative.   Social History   Socioeconomic  History  . Marital status: Widowed    Spouse name: Not on file  . Number of children:  3  . Years of education: Not on file  . Highest education level: Not on file  Occupational History  . Not on file  Social Needs  . Financial resource strain: Not on file  . Food insecurity:    Worry: Not on file    Inability: Not on file  . Transportation needs:    Medical: Not on file    Non-medical: Not on file  Tobacco Use  . Smoking status: Never Smoker  . Smokeless tobacco: Never Used  Substance and Sexual Activity  . Alcohol use: Never    Alcohol/week: 0.0 standard drinks    Frequency: Never  . Drug use: Never  . Sexual activity: Not on file  Lifestyle  . Physical activity:    Days per week: Not on file    Minutes per session: Not on file  . Stress: Not on file  Relationships  . Social connections:    Talks on phone: Not on file    Gets together: Not on file    Attends religious service: Not on file    Active member of club or organization: Not on file    Attends meetings of clubs or organizations: Not on file    Relationship status: Not on file  . Intimate partner violence:    Fear of current or ex partner: Not on file    Emotionally abused: Not on file    Physically abused: Not on file    Forced sexual activity: Not on file  Other Topics Concern  . Not on file  Social History Narrative   Lives at home with her 3 children   Right handed   Caffeine: very rare    Family History  Problem Relation Age of Onset  . Hypertension Mother   . Colon polyps Mother   . Diabetes Mother   . Heart disease Mother   . Depression Mother   . Congestive Heart Failure Mother   . Dementia Mother   . Colon cancer Maternal Grandmother   . ADD / ADHD Daughter   . Asperger's syndrome Daughter   . Heart disease Maternal Grandfather   . Alzheimer's disease Paternal Grandmother     Past Medical History:  Diagnosis Date  . Anemia   . Anxiety   . Asthma    childhood  . Depression   . Fatty liver   . Gallstones   . Gastric ulcer   . GERD (gastroesophageal reflux  disease)   . Gestational diabetes   . Heart murmur    "nothing to woorry about"  . Hiatal hernia   . Iron deficiency anemia   . Pneumonia 2017  . Restless leg   . Shingles   . Shortness of breath dyspnea    with exertion  . UTI (lower urinary tract infection)    frequently  . Vitamin D deficiency     Patient Active Problem List   Diagnosis Date Noted  . GAD (generalized anxiety disorder) 06/27/2018  . Major depressive disorder, recurrent episode, moderate ([place]) 06/27/2018  . Lobar pneumonia ([place])   . Infiltrate noted on imaging study 06/01/2018  . Acute respiratory failure with hypoxia ([place]) 06/01/2018  . Asthma, chronic, unspecified asthma severity, with acute exacerbation 06/01/2018  . Generalized weakness 06/01/2018  . Anxiety 06/01/2018  . Pulmonary edema, acute ([place]) 02/14/2016    Past Surgical History:  Procedure Laterality Date  .  CHOLECYSTECTOMY N/A 02/14/2016   Procedure: LAPAROSCOPIC CHOLECYSTECTOMY;  Surgeon: Coralie Keens, MD;  Location: [place];  Service: General;  Laterality: N/A;  . CHOLECYSTECTOMY  02/14/2016  . TUBAL LIGATION  11-27-2014  . WISDOM TOOTH EXTRACTION      Current Outpatient Medications  Medication Sig Dispense Refill  . buPROPion (WELLBUTRIN SR) 100 MG 12 hr tablet Take 1 tablet (100 mg total) by mouth 2 (two) times daily for 30 days. 60 tablet 0  . diazepam (VALIUM) 2 MG tablet Take 1 tablet (2 mg total) by mouth every 12 (twelve) hours as needed for up to 30 days for anxiety. 30 tablet 0  . FLUoxetine (PROZAC) 20 MG tablet Take 2 tablets (40 mg total) by mouth daily for 30 days. 60 tablet 0  . lisinopril (PRINIVIL,ZESTRIL) 20 MG tablet Take 20 mg by mouth daily.    . montelukast (SINGULAIR) 10 MG tablet Take 10 mg by mouth at bedtime.    . pramipexole (MIRAPEX) 0.25 MG tablet Take 1 tablet (0.25 mg total) by mouth at bedtime for 30 days. Reported on 06/09/2015 (Patient taking differently: Take 0.75 mg by mouth at bedtime. Reported on  06/09/2015) 30 tablet 1  . traZODone (DESYREL) 50 MG tablet Take 1 tablet (50 mg total) by mouth at bedtime as needed for up to 30 days for sleep. 30 tablet 0   No current facility-administered medications for this visit.     Allergies as of 07/22/2018 - Review Complete 07/22/2018  Allergen Reaction Noted  . Tape Rash 06/01/2018    Vitals: BP 139/82 (BP Location: Left Arm, Patient Position: Sitting)   Pulse 76   Ht 5\' 3"  (1.6 m)   Wt 229 lb (103.9 kg)   BMI 40.57 kg/m  Last Weight:  Wt Readings from Last 1 Encounters:  07/22/18 229 lb (103.9 kg)   Last Height:   Ht Readings from Last 1 Encounters:  07/22/18 5\' 3"  (1.6 m)     Physical exam: Exam: Gen: NAD, conversant, well nourised, obese, well groomed                     CV: RRR, no MRG. No Carotid Bruits. No peripheral edema, warm, nontender Eyes: Conjunctivae clear without exudates or hemorrhage  Neuro: Detailed Neurologic Exam  Speech:    Speech is normal; fluent and spontaneous with normal comprehension.  Cognition:    The patient is oriented to person, place, and time;     recent and remote memory intact;     language fluent;     normal attention, concentration,     fund of knowledge Cranial Nerves:    The pupils are equal, round, and reactive to light. The fundi are normal and spontaneous venous pulsations are present. Visual fields are full to finger confrontation. Extraocular movements are intact. Trigeminal sensation is intact and the muscles of mastication are normal. The face is symmetric. The palate elevates in the midline. Hearing intact. Voice is normal. Shoulder shrug is normal. The tongue has normal motion without fasciculations.   Coordination:    No dysmetria noted  Gait:    Patient is wide based, with low clearance, cannot heel or toe walk. Patient drags her toe but dorsiflexion appears normal when testing for babinski, brisk strong dorsiflexion.   Motor Observation:    No asymmetry, no  atrophy, and no involuntary movements noted. Tone:    Normal muscle tone.    Posture:    Posture is normal. normal erect  Strength: generally 4/5 with giveway, slightly inreased weakness right triceps and LE leg flexion but overall generally weak however prominent giveway and poor efort         Sensation: sensory loss on the right arm and right leg. Midline to vibration in the forehead says she can;t even feel vibration in the right side of the skull.      Reflex Exam:  DTR's:    Deep tendon reflexes in the upper and lower extremities are brisk bilaterally.   Toes:    The toes are downgoing bilaterally.   Clonus:    Clonus is absent.    Assessment/Plan:   43 y.o. female here as requested by provider Hungarland, Jenetta Downer,* for memory loss, possible decreased right-sided strength. PMHx major depressive disorder, anemia, hypertension, abnormal glucose, restless leg syndrome, depression and fatigue. MRI brain unremarkable. Been evaluated by neurology inpatient for acute generalized weakness without etiology.   - patient's presentation and exam not consistent with any neurologic disorder that I am aware of.  She has poor effort on exam with giveaway strength, inconsistent findings, for example she drags her toes when walking however she does have good strength in the dorsiflexors of the feet.  She splits midline he cannot feel vibration on the right side of her head.  She has generalized weakness with some mildly increased weakness on the right not in a pattern that is recognizable for any disorder.  -Acute onset not consistent with myasthenia gravis, myopathy, acute bilateral weakness in all 4 limbs not consistent with stroke, also not consistent with lesions of the spinal cord such as transverse myelitis cannot rule out cervical cord infarct or other etiology there will need MRI of the cervical spine.  She has excellent reflexes making Guyon Aris Lot a very unlikely.  Patient reports her arms  are involved which then makes lumbar radiculopathy very unlikely.  Again presentation and exam not consistent with any neurologic disorder.  -Her B12 is low normal, will check again with methylmalonic acid.  - Need mri of the cervical spine to evaluate for cord etiologies, need contrast to eval for demyelinating lesions (MS), cervical cord spinal stroke or other etiologies.  - EMG/NCS of the right upper and lower extremity to rule out peripheral nerve and muscle disorders, again unlikely  - Per notes,  Current affective symptoms include anhedonia, increased appetite, insomnia, decreased ability to concentrate and tendency toward indecisiveness.  She notes recent stressors including recent hospitalization and severe illness. Needs follow up here.  Orders Placed This Encounter  Procedures  . MR CERVICAL SPINE W WO CONTRAST  . B12 and Folate Panel  . Methylmalonic acid, serum  . Vitamin B1  . NCV with EMG(electromyography)     Orders Placed This Encounter  Procedures  . MR CERVICAL SPINE W WO CONTRAST  . B12 and Folate Panel  . Methylmalonic acid, serum  . Vitamin B1  . NCV with EMG(electromyography)   No orders of the defined types were placed in this encounter.   Cc: Hungarland, Jenetta Downer,*,  Hungarland, Jenetta Downer, MD  Sarina Ill, MD  [facility] [address] [place],  [address]  Phone [phone] Fax [phone]

## 2018-07-22 NOTE — Patient Instructions (Addendum)
Needs emg/ncs MRI of the cervical spine Needs follow-up in optometry or ophthalmology

## 2018-07-23 ENCOUNTER — Telehealth: Payer: Self-pay | Admitting: Neurology

## 2018-07-23 NOTE — Telephone Encounter (Signed)
[employer] order sent to GI. They will obtain the auth and reach out to the pt to schedule.  °

## 2018-07-24 ENCOUNTER — Other Ambulatory Visit (HOSPITAL_COMMUNITY): Payer: Self-pay | Admitting: Psychiatry

## 2018-07-25 ENCOUNTER — Encounter (HOSPITAL_COMMUNITY): Payer: Self-pay | Admitting: Psychiatry

## 2018-07-25 ENCOUNTER — Ambulatory Visit (INDEPENDENT_AMBULATORY_CARE_PROVIDER_SITE_OTHER): Payer: 59 | Admitting: Psychiatry

## 2018-07-25 VITALS — BP 130/80 | HR 86 | Ht 63.0 in | Wt 226.4 lb

## 2018-07-25 DIAGNOSIS — F331 Major depressive disorder, recurrent, moderate: Secondary | ICD-10-CM | POA: Diagnosis not present

## 2018-07-25 DIAGNOSIS — F411 Generalized anxiety disorder: Secondary | ICD-10-CM

## 2018-07-25 LAB — B12 AND FOLATE PANEL
Folate: 14.1 ng/mL (ref >3.0)
Vitamin B-12: 304 pg/mL (ref 232–1245)

## 2018-07-25 LAB — METHYLMALONIC ACID, SERUM: Methylmalonic Acid: 186 nmol/L (ref 0–378)

## 2018-07-25 LAB — VITAMIN B1: THIAMINE: 97.5 nmol/L (ref 66.5–200.0)

## 2018-07-25 MED ORDER — TRAZODONE HCL 50 MG PO TABS: 50.0000 mg | ORAL_TABLET | Freq: Every evening | ORAL | 0 refills | Status: DC | PRN

## 2018-07-25 MED ORDER — PRAMIPEXOLE DIHYDROCHLORIDE 1 MG PO TABS: 1.0000 mg | ORAL_TABLET | Freq: Every day | ORAL | 0 refills | Status: AC

## 2018-07-25 MED ORDER — DIAZEPAM 2 MG PO TABS: 2.0000 mg | ORAL_TABLET | Freq: Two times a day (BID) | ORAL | 2 refills | Status: DC | PRN

## 2018-07-25 MED ORDER — VORTIOXETINE HBR 5 MG PO TABS: ORAL_TABLET | ORAL | 0 refills | Status: DC

## 2018-07-25 MED ORDER — BUPROPION HCL ER (SR) 150 MG PO TB12: 150.0000 mg | ORAL_TABLET | Freq: Two times a day (BID) | ORAL | 0 refills | Status: DC

## 2018-07-25 NOTE — Progress Notes (Signed)
[facility] MD/PA/NP OP Progress Note  07/25/2018 8:07 AM Leslie Robinson  MRN:  [id number]  Chief Complaint: Anxiety, depression HPI: 43 yo widowed female who has been recently hospitalized for community acquired pneumonia now coming for her follow up visit. She has reported depression and anxiety (excessive worrying, occasional panic attacks) since loss of her father  In 2003. She also lost her husband to ALS 16 months ago and is still grieving. She reports depressed mood, excessive worrying, panic attacks, middle insomnia, racing thoughts, poor concentration, some confusion, memory problems, low energy. We have increased dose of bupropion to 100 mg bid and fluoxetine to 40 mg daily a month ago. trazodone was added for insomnia and diazepam 2 mg restarted (was on it while in the hospital) as needed for anxiety. Sleep has improved, anxiety responds well to diazepam but she reports being as depressed and apathetic as she was before adjustment to dose of fluoxetine was made. Recently again reported weakness (right sided) and has seen a neurologist. Plan is to consult with neurology at [facility]. Visit Diagnosis:    ICD-10-CM   1. Major depressive disorder, recurrent episode, moderate (HCC) F33.1   2. GAD (generalized anxiety disorder) F41.1     Past Psychiatric History: Please refer to original H&P.  Past Medical History:  Past Medical History:  Diagnosis Date  . Anemia   . Anxiety   . Asthma    childhood  . Depression   . Fatty liver   . Gallstones   . Gastric ulcer   . GERD (gastroesophageal reflux disease)   . Gestational diabetes   . Heart murmur    "nothing to woorry about"  . Hiatal hernia   . Iron deficiency anemia   . Pneumonia 2017  . Restless leg   . Shingles   . Shortness of breath dyspnea    with exertion  . UTI (lower urinary tract infection)    frequently  . Vitamin D deficiency     Past Surgical History:  Procedure Laterality Date  . CHOLECYSTECTOMY N/A 02/14/2016    Procedure: LAPAROSCOPIC CHOLECYSTECTOMY;  Surgeon: Coralie Keens, MD;  Location: [place];  Service: General;  Laterality: N/A;  . CHOLECYSTECTOMY  02/14/2016  . TUBAL LIGATION  11-27-2014  . WISDOM TOOTH EXTRACTION      Family Psychiatric History: reviewed  Family History:  Family History  Problem Relation Age of Onset  . Hypertension Mother   . Colon polyps Mother   . Diabetes Mother   . Heart disease Mother   . Depression Mother   . Congestive Heart Failure Mother   . Dementia Mother   . Colon cancer Maternal Grandmother   . ADD / ADHD Daughter   . Asperger's syndrome Daughter   . Heart disease Maternal Grandfather   . Alzheimer's disease Paternal Grandmother     Social History:  Social History   Socioeconomic History  . Marital status: Widowed    Spouse name: Not on file  . Number of children: 3  . Years of education: Not on file  . Highest education level: Not on file  Occupational History  . Not on file  Social Needs  . Financial resource strain: Not on file  . Food insecurity:    Worry: Not on file    Inability: Not on file  . Transportation needs:    Medical: Not on file    Non-medical: Not on file  Tobacco Use  . Smoking status: Never Smoker  . Smokeless tobacco: Never Used  Substance and Sexual Activity  . Alcohol use: Never    Alcohol/week: 0.0 standard drinks    Frequency: Never  . Drug use: Never  . Sexual activity: Not on file  Lifestyle  . Physical activity:    Days per week: Not on file    Minutes per session: Not on file  . Stress: Not on file  Relationships  . Social connections:    Talks on phone: Not on file    Gets together: Not on file    Attends religious service: Not on file    Active member of club or organization: Not on file    Attends meetings of clubs or organizations: Not on file    Relationship status: Not on file  Other Topics Concern  . Not on file  Social History Narrative   Lives at home with her 3 children   Right  handed   Caffeine: very rare    Allergies:  Allergies  Allergen Reactions  . Tape Rash    Metabolic Disorder Labs: No results found for: HGBA1C, MPG No results found for: PROLACTIN No results found for: CHOL, TRIG, HDL, CHOLHDL, VLDL, LDLCALC Lab Results  Component Value Date   TSH 1.038 06/01/2018    Therapeutic Level Labs: No results found for: LITHIUM No results found for: VALPROATE No components found for:  CBMZ  Current Medications: Current Outpatient Medications  Medication Sig Dispense Refill  . buPROPion (WELLBUTRIN SR) 100 MG 12 hr tablet Take 1 tablet (100 mg total) by mouth 2 (two) times daily for 30 days. 60 tablet 0  . diazepam (VALIUM) 2 MG tablet Take 1 tablet (2 mg total) by mouth every 12 (twelve) hours as needed for up to 30 days for anxiety. 30 tablet 0  . FLUoxetine (PROZAC) 20 MG tablet Take 2 tablets (40 mg total) by mouth daily for 30 days. 60 tablet 0  . lisinopril (PRINIVIL,ZESTRIL) 20 MG tablet Take 20 mg by mouth daily.    . montelukast (SINGULAIR) 10 MG tablet Take 10 mg by mouth at bedtime.    . pramipexole (MIRAPEX) 0.25 MG tablet Take 1 tablet (0.25 mg total) by mouth at bedtime for 30 days. Reported on 06/09/2015 (Patient taking differently: Take 0.75 mg by mouth at bedtime. Reported on 06/09/2015) 30 tablet 1  . traZODone (DESYREL) 50 MG tablet Take 1 tablet (50 mg total) by mouth at bedtime as needed for up to 30 days for sleep. 30 tablet 0   No current facility-administered medications for this visit.      Musculoskeletal: Strength & Muscle Tone: within normal limits Gait & Station: normal Patient leans: N/A  Psychiatric Specialty Exam: Review of Systems  Constitutional: Positive for malaise/fatigue.  HENT: Negative.   Eyes: Negative.   Respiratory: Negative.   Cardiovascular: Negative.   Gastrointestinal: Negative.   Genitourinary: Negative.   Musculoskeletal: Negative.   Neurological: Positive for weakness.   Endo/Heme/Allergies: Negative.   Psychiatric/Behavioral: Positive for depression. The patient is nervous/anxious.     There were no vitals taken for this visit.There is no height or weight on file to calculate BMI.  General Appearance: Well Groomed  Eye Contact:  Good  Speech:  Clear and Coherent  Volume:  Normal  Mood:  Anxious and Depressed  Affect:  Constricted  Thought Process:  Goal Directed  Orientation:  Full (Time, Place, and Person)  Thought Content: Logical   Suicidal Thoughts:  No  Homicidal Thoughts:  No  Memory:  Immediate;   Fair Recent;  Fair Remote;   Fair  Judgement:  Intact  Insight:  Fair  Psychomotor Activity:  Normal  Concentration:  Concentration: Fair  Recall:  [place] of Knowledge: Good  Language: Good  Akathisia:  Negative  Handed:  Right  AIMS (if indicated): not done  Assets:  Communication Skills Desire for Improvement Housing Social Support  ADL's:  Intact  Cognition: WNL  Sleep:  Good   Screenings: Mini-Mental     Office Visit from 07/22/2018 in [facility]  Total Score (max 30 points )  26       Assessment and Plan: 43 yo widowed female who has been recently hospitalized for community acquired pneumonia now coming for her follow up visit. She has reported depression and anxiety (excessive worrying, occasional panic attacks) since loss of her father  In 2003. She also lost her husband to ALS 16 months ago and is still grieving. She reports depressed mood, excessive worrying, panic attacks, middle insomnia, racing thoughts, poor concentration, some confusion, memory problems, low energy. We have increased dose of bupropion to 100 mg bid and fluoxetine to 40 mg daily a month ago. trazodone was added for insomnia and diazepam 2 mg restarted (was on it while in the hospital) as needed for anxiety. Sleep has improved, anxiety responds well to diazepam but she reports being as depressed and apathetic as she was before  adjustment in dose of Prozac was made.  Dx: MDD recurrent, moderate; GAD  Plan; Discontinue fluoxetine and start vortioxetine instead (5 mg x 7 days then 10 mg daily). Increase dose of bupropion to 150 mg bid. Continue trazodone 50 mg and diazepam 2 mg prn sleep, anxiety, respectively. Return to clinic ion one month. We will also make appointment for individual counseling.    Stephanie Acre, MD 07/25/2018, 8:07 AM

## 2018-07-25 NOTE — Patient Instructions (Signed)
Plan;  1. Decrease Prozac to one capsule every other day, take it for 3 consecutive doses then stop.  2. Start Trintellix 5 mg daily for 7 days then start taking two tablets at the same time.  3. Continue trazodone, diazepam as taken previously. Dosed of Wellbutrin is increased to 150 mg twice daily.

## 2018-07-29 ENCOUNTER — Telehealth: Payer: Self-pay | Admitting: *Deleted

## 2018-07-29 NOTE — Telephone Encounter (Signed)
R/c mri report/ cd from [place]. Report and cd on [place] desk.

## 2018-07-30 ENCOUNTER — Encounter: Payer: Self-pay | Admitting: Neurology

## 2018-07-31 ENCOUNTER — Other Ambulatory Visit (HOSPITAL_COMMUNITY): Payer: Self-pay | Admitting: Psychiatry

## 2018-08-05 ENCOUNTER — Other Ambulatory Visit: Payer: Self-pay | Admitting: Neurology

## 2018-08-06 ENCOUNTER — Other Ambulatory Visit: Payer: Self-pay

## 2018-08-06 ENCOUNTER — Ambulatory Visit (INDEPENDENT_AMBULATORY_CARE_PROVIDER_SITE_OTHER): Payer: 59 | Admitting: Psychiatry

## 2018-08-06 ENCOUNTER — Telehealth: Payer: Self-pay | Admitting: Pulmonary Disease

## 2018-08-06 ENCOUNTER — Encounter (HOSPITAL_COMMUNITY): Payer: Self-pay | Admitting: Psychiatry

## 2018-08-06 DIAGNOSIS — R413 Other amnesia: Secondary | ICD-10-CM

## 2018-08-06 DIAGNOSIS — Z634 Disappearance and death of family member: Secondary | ICD-10-CM | POA: Diagnosis not present

## 2018-08-06 DIAGNOSIS — F331 Major depressive disorder, recurrent, moderate: Secondary | ICD-10-CM

## 2018-08-06 NOTE — Progress Notes (Signed)
Comprehensive Clinical Assessment (CCA) Note  08/06/2018 Leslie Robinson [id number]  Visit Diagnosis:      ICD-10-CM   1. Major depressive disorder, recurrent episode, moderate (HCC) F33.1   2. Death of partner Z63.4   3. Memory difficulties R41.3       CCA Part One  Part One has been completed on paper by the patient.  (See scanned document in Chart Review)  CCA Part Two A  Intake/Chief Complaint:  CCA Intake With Chief Complaint CCA Part Two Date: 08/06/18 CCA Part Two Time: 0800 Chief Complaint/Presenting Problem: Experiencing depression due to loss of parents and husband, and recent unexplainable health issues. Patients Currently Reported Symptoms/Problems: Not able to get out of bed, sad, no motivation, loss of interest, less active in church and with kids, isolating more, communication issues, negative thoughts Collateral Involvement: Dr. Andris Baumann, Occupational, Physical, and Speech Therapy with [facility] Individual's Strengths: Play piano, flower arrangements, parenting, cooking cleaning Individual's Preferences: Isolating, being alone, figuring out what's causing all her medical issues, "I want to get my life and children back" Individual's Abilities: Can advocate for her health Type of Services Patient Feels Are Needed: Individual Therapy and Medication Management Initial Clinical Notes/Concerns: Leslie Robinson has fainting spells that are simular to seizures; if this occurs in the office, protect her head, keep body safe and check blood pressure afterwards, it passes after a minute or two.   Mental Health Symptoms Depression:  Depression: Change in energy/activity, Difficulty Concentrating, Fatigue, Irritability, Tearfulness, Sleep (too much or little), Weight gain/loss, Hopelessness, Worthlessness(Severe restless leg at night, staying asleep is hard; lost 10 lbs recently)  Mania:  Mania: N/A  Anxiety:   Anxiety: Difficulty concentrating, Fatigue, Irritability, Restlessness,  Sleep, Worrying, Tension  Psychosis:  Psychosis: N/A  Trauma:  Trauma: Avoids reminders of event, Emotional numbing, Re-experience of traumatic event, Guilt/shame, Detachment from others, Difficulty staying/falling asleep, Irritability/anger(Loss of parents, husband, medical treatment,)  Obsessions:  Obsessions: N/A  Compulsions:  Compulsions: N/A  Inattention:  Inattention: N/A  Hyperactivity/Impulsivity:  Hyperactivity/Impulsivity: N/A  Oppositional/Defiant Behaviors:  Oppositional/Defiant Behaviors: N/A  Borderline Personality:  Emotional Irregularity: Chronic feelings of emptiness, Unstable self-image  Other Mood/Personality Symptoms:  Other Mood/Personality Symtpoms: Participated in Grief Therapy without positive outcomes due to transference of counselor   Mental Status Exam Appearance and self-care  Stature:  Stature: Small  Weight:  Weight: Obese  Clothing:  Clothing: Neat/clean  Grooming:  Grooming: Normal  Cosmetic use:  Cosmetic Use: Age appropriate  Posture/gait:  Posture/Gait: Normal  Motor activity:  Motor Activity: Not Remarkable  Sensorium  Attention:  Attention: Normal  Concentration:  Concentration: Normal  Orientation:  Orientation: X5  Recall/memory:  Recall/Memory: Normal  Affect and Mood  Affect:  Affect: Appropriate  Mood:  Mood: Depressed  Relating  Eye contact:  Eye Contact: Normal  Facial expression:  Facial Expression: Responsive  Attitude toward examiner:  Attitude Toward Examiner: Cooperative  Thought and Language  Speech flow: Speech Flow: Normal  Thought content:  Thought Content: Appropriate to mood and circumstances  Preoccupation:  Preoccupations: Guilt, Somatic  Hallucinations:     Organization:     Transport planner of Knowledge:  Fund of Knowledge: Average  Intelligence:  Intelligence: Average  Abstraction:  Abstraction: Normal  Judgement:  Judgement: Normal  Reality Testing:  Reality Testing: Realistic  Insight:  Insight: Good   Decision Making:  Decision Making: Normal  Social Functioning  Social Maturity:  Social Maturity: Isolates, Responsible  Social Judgement:  Social Judgement: Normal  Stress  Stressors:  Stressors: Family conflict, Grief/losses, Illness, Work, Transitions  Coping Ability:  Coping Ability: Research officer, political party Deficits:     Supports:      Family and Psychosocial History: Family history Marital status: Widowed Widowed, when?: October 7th, 2018-Husband passed from [place] Are you sexually active?: No What is your sexual orientation?: Heterosexual Has your sexual activity been affected by drugs, alcohol, medication, or emotional stress?: no Does patient have children?: Yes How many children?: 3 How is patient's relationship with their children?: 74 yo Daughter (Lives at home, in longter relationship, helping out with kids), 38 yo daughter (has Aspergers, ADHD, Dyslexia), 7 year old son (currently living with family due to Leslie Robinson's medical issues, sees him in the afternoons)  Childhood History:  Childhood History By whom was/is the patient raised?: Both parents Additional childhood history information: Enjoyed childhood Description of patient's relationship with caregiver when they were a child: Close with dad, not affectionate or verbalizing "I love you", "mom and I were good" Patient's description of current relationship with people who raised him/her: Stayed close and connected, mom passed from dementia, dad passed from COPD. Does patient have siblings?: No Did patient suffer any verbal/emotional/physical/sexual abuse as a child?: No Did patient suffer from severe childhood neglect?: No Has patient ever been sexually abused/assaulted/raped as an adolescent or adult?: No Was the patient ever a victim of a crime or a disaster?: No Witnessed domestic violence?: No Has patient been effected by domestic violence as an adult?: No  CCA Part Two B  Employment/Work Situation: Employment / Work  Situation Employment situation: Employed Where is patient currently employed?: [employer] 10 years How long has patient been employed?: Her adulthood years Patient's job has been impacted by current illness: Yes Describe how patient's job has been impacted: Since Jan medical issues, she has not been back to work. She is on short term disability, but it will run out soon. Plans to see specialists at [facility] soon for more testing. She is mostly concerned about her memory issues.  What is the longest time patient has a held a job?: 10 years Did You Receive Any Psychiatric Treatment/Services While in the [employer]?: No Are There Guns or Other Weapons in [place]?: Yes Are These Psychologist, educational?: Yes  Education: Education Last Grade Completed: 12 Name of High School: [employer] Did Teacher, adult education From [employer]?: Yes Did Physicist, medical?: Yes What Type of College Degree Do you Have?: [employer] What Was Your Major?: Medical Transcritpion Did You Have Any Special Interests In School?: English and Spelling Did You Have An Individualized Education Program (IIEP): No Did You Have Any Difficulty At School?: No  Religion: Religion/Spirituality Are You A Religious Person?: Yes What is Your Religious Affiliation?: Pentecostal How Might This Affect Treatment?: Has been helpful in getting through  Leisure/Recreation: Leisure / Recreation Leisure and Hobbies: Flower arrangements and piano, attending church  Exercise/Diet: Exercise/Diet Do You Exercise?: No Have You Gained or Lost A Significant Amount of Weight in the Past Six Months?: Yes-Lost Number of Pounds Lost?: 10 Do You Follow a Special Diet?: No Do You Have Any Trouble Sleeping?: Yes Explanation of Sleeping Difficulties: Restless legs, waking during the night  CCA Part Two C  Alcohol/Drug Use: Alcohol / Drug Use Pain Medications: See MAR Prescriptions: See MAR Over the Counter: See MAR History  of alcohol / drug use?: No history of alcohol / drug abuse  CCA Part Three  ASAM's:  Six Dimensions of Multidimensional Assessment  Dimension 1:  Acute Intoxication and/or Withdrawal Potential:     Dimension 2:  Biomedical Conditions and Complications:     Dimension 3:  Emotional, Behavioral, or Cognitive Conditions and Complications:     Dimension 4:  Readiness to Change:     Dimension 5:  Relapse, Continued use, or Continued Problem Potential:     Dimension 6:  Recovery/Living Environment:      Substance use Disorder (SUD)    Social Function:  Social Functioning Social Maturity: Isolates, Responsible Social Judgement: Normal  Stress:  Stress Stressors: Family conflict, Grief/losses, Illness, Work, Transitions Coping Ability: Exhausted Patient Takes Medications The Way The Doctor Instructed?: Yes Priority Risk: Low Acuity  Risk Assessment- Self-Harm Potential: Risk Assessment For Self-Harm Potential Thoughts of Self-Harm: No current thoughts Method: No plan Availability of Means: No access/NA  Risk Assessment -Dangerous to Others Potential: Risk Assessment For Dangerous to Others Potential Method: No Plan Availability of Means: No access or NA Intent: Vague intent or NA Notification Required: No need or identified person  DSM5 Diagnoses: Patient Active Problem List   Diagnosis Date Noted  . GAD (generalized anxiety disorder) 06/27/2018  . Major depressive disorder, recurrent episode, moderate ([place]) 06/27/2018  . Lobar pneumonia ([place])   . Infiltrate noted on imaging study 06/01/2018  . Acute respiratory failure with hypoxia ([place]) 06/01/2018  . Asthma, chronic, unspecified asthma severity, with acute exacerbation 06/01/2018  . Generalized weakness 06/01/2018  . Anxiety 06/01/2018  . Pulmonary edema, acute ([place]) 02/14/2016    Patient Centered Plan: Patient is on the following Treatment Plan(s):  Depression  Recommendations for  Services/Supports/Treatments: Recommendations for Services/Supports/Treatments Recommendations For Services/Supports/Treatments: Individual Therapy, Medication Management  Treatment Plan Summary: OP Treatment Plan Summary: Manasvi would like to decrease her depressive symptoms through learning and application of coping skills related to grief and loss and new identity as widower and single mother.   Referrals to Alternative Service(s): Referred to Alternative Service(s):   Place:   Date:   Time:    Referred to Alternative Service(s):   Place:   Date:   Time:    Referred to Alternative Service(s):   Place:   Date:   Time:    Referred to Alternative Service(s):   Place:   Date:   Time:     [name] LCSW

## 2018-08-06 NOTE — Telephone Encounter (Signed)
Contacted [facility] to get CD and report of Chest CT scheduled for 06/26/18 in [place].  Informed patient has rescheduled appt for 08/09/18.  Patient has appointment with Dr. Loanne Drilling on 08/12/2018.  Patient has rescheduled appointments several times.  [facility] states they can give patient the CD prior to leaving their facility so she will have it for Dr. Cordelia Pen appointment 08/12/18.

## 2018-08-07 ENCOUNTER — Other Ambulatory Visit: Payer: Self-pay

## 2018-08-07 ENCOUNTER — Other Ambulatory Visit: Payer: Self-pay | Admitting: Neurology

## 2018-08-07 ENCOUNTER — Ambulatory Visit
Admission: RE | Admit: 2018-08-07 | Discharge: 2018-08-07 | Disposition: A | Discharge status: Home / Self Care | Payer: Managed Care, Other (non HMO) | Source: Ambulatory Visit | Attending: Neurology | Admitting: Neurology

## 2018-08-07 DIAGNOSIS — R200 Anesthesia of skin: Secondary | ICD-10-CM

## 2018-08-07 DIAGNOSIS — R270 Ataxia, unspecified: Secondary | ICD-10-CM

## 2018-08-07 DIAGNOSIS — R531 Weakness: Secondary | ICD-10-CM

## 2018-08-08 ENCOUNTER — Telehealth: Payer: Self-pay | Admitting: Neurology

## 2018-08-08 NOTE — Telephone Encounter (Signed)
[employer] patient is scheduled at [facility] for 08/11/18 they will obtain the auth.

## 2018-08-08 NOTE — Telephone Encounter (Signed)
Novella Rob: [id number] (exp. 08/05/18 to 11/03/18)

## 2018-08-11 ENCOUNTER — Ambulatory Visit
Admission: RE | Admit: 2018-08-11 | Discharge: 2018-08-11 | Disposition: A | Discharge status: Home / Self Care | Payer: Managed Care, Other (non HMO) | Source: Ambulatory Visit | Attending: Neurology | Admitting: Neurology

## 2018-08-11 ENCOUNTER — Other Ambulatory Visit: Payer: Self-pay

## 2018-08-11 DIAGNOSIS — R531 Weakness: Secondary | ICD-10-CM

## 2018-08-11 DIAGNOSIS — R270 Ataxia, unspecified: Secondary | ICD-10-CM

## 2018-08-11 DIAGNOSIS — R200 Anesthesia of skin: Secondary | ICD-10-CM

## 2018-08-11 MED ORDER — GADOBENATE DIMEGLUMINE 529 MG/ML IV SOLN
20.0000 mL | Freq: Once | INTRAVENOUS | Status: AC | PRN
  Administered 2018-08-11: 20 mL via INTRAVENOUS

## 2018-08-12 ENCOUNTER — Inpatient Hospital Stay: Payer: Self-pay | Admitting: Pulmonary Disease

## 2018-08-13 ENCOUNTER — Telehealth: Payer: Self-pay | Admitting: Neurology

## 2018-08-13 NOTE — Telephone Encounter (Signed)
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 These arthritic changes would not cause her symptoms. I do not have anymore for this patient as I discussed with ehr, she is to follow back up with pcp who can send her to an academic center if they like.

## 2018-08-13 NOTE — Telephone Encounter (Signed)
I would go to [facility] [place] first and not worry about the emg/ncs, if they want it they can do it there as well thanks

## 2018-08-13 NOTE — Telephone Encounter (Signed)
I spoke with the patient and reviewed MRI c-spine results from Dr. Jaynee Eagles. Pt agreeable to plan.  She stated she has an appt with [facility] on 09/06/2018. She would like to make sure her EMG isn't needed here on 4/23. I informed pt that I would check with Dr. Jaynee Eagles and then call her back. Pt verbalized appreciation.

## 2018-08-13 NOTE — Telephone Encounter (Signed)
Pt called for her MRI results. Please advise.

## 2018-08-13 NOTE — Telephone Encounter (Signed)
Spoke with patient and advised that [facility] can see her and do the EMG if they would like one. She verbalized understanding and appreciation. EMG/NCS for 4/23 canceled.

## 2018-08-22 ENCOUNTER — Other Ambulatory Visit: Payer: Self-pay

## 2018-08-22 ENCOUNTER — Ambulatory Visit (INDEPENDENT_AMBULATORY_CARE_PROVIDER_SITE_OTHER): Payer: 59 | Admitting: Psychiatry

## 2018-08-22 DIAGNOSIS — F411 Generalized anxiety disorder: Secondary | ICD-10-CM | POA: Diagnosis not present

## 2018-08-22 DIAGNOSIS — F320 Major depressive disorder, single episode, mild: Secondary | ICD-10-CM | POA: Diagnosis not present

## 2018-08-22 MED ORDER — VORTIOXETINE HBR 20 MG PO TABS: 20.0000 mg | ORAL_TABLET | Freq: Every day | ORAL | 2 refills | Status: DC

## 2018-08-22 MED ORDER — DIAZEPAM 2 MG PO TABS: 2.0000 mg | ORAL_TABLET | Freq: Two times a day (BID) | ORAL | 2 refills | Status: DC | PRN

## 2018-08-22 MED ORDER — TRAZODONE HCL 50 MG PO TABS: 50.0000 mg | ORAL_TABLET | Freq: Every evening | ORAL | 0 refills | Status: DC | PRN

## 2018-08-22 MED ORDER — BUPROPION HCL ER (SR) 150 MG PO TB12: 150.0000 mg | ORAL_TABLET | Freq: Two times a day (BID) | ORAL | 0 refills | Status: DC

## 2018-08-22 NOTE — Progress Notes (Signed)
[employer] MD/PA/NP OP Progress Note  08/22/2018 8:41 AM Leslie Robinson  MRN:  [id number]  Interview was conducted using teleconferencing and I verified that I was speaking with the correct person using two identifiers. I discussed the limitations of evaluation and management by telemedicine and  the availability of in person appointments. Patient expressed understanding and agreed to proceed.  Chief Complaint: Some anxiety and depression  HPI: 43 yo widowed female with major depression and anxiety (excessive worrying, occasional panic attacks) since loss of her father In 2003. She also lost her husband to ALS 16 months ago and is still grieving. She reports feeling less depressed and less anxious after addition of Trintellix. Rare panic attacks, sleep improved and so did concentration and energy (Wellbutrin dose was increased to 150 mg bid). She takes trazodone for insomnia and diazepam 2 mgas needed for anxiety. She started counseling (with [facility]). She will have neurology consultation at [facility] for intermittent weakness on April 17th.  Visit Diagnosis:    ICD-10-CM   1. GAD (generalized anxiety disorder) F41.1   2. Current mild episode of major depressive disorder without prior episode ([place]) F32.0     Past Psychiatric History: Please refer to intake H&P.  Past Medical History:  Past Medical History:  Diagnosis Date  . Anemia   . Anxiety   . Asthma    childhood  . Depression   . Fatty liver   . Gallstones   . Gastric ulcer   . GERD (gastroesophageal reflux disease)   . Gestational diabetes   . Heart murmur    "nothing to woorry about"  . Hiatal hernia   . Iron deficiency anemia   . Pneumonia 2017  . Restless leg   . Shingles   . Shortness of breath dyspnea    with exertion  . UTI (lower urinary tract infection)    frequently  . Vitamin D deficiency     Past Surgical History:  Procedure Laterality Date  . CHOLECYSTECTOMY N/A 02/14/2016   Procedure: LAPAROSCOPIC CHOLECYSTECTOMY;   Surgeon: Coralie Keens, MD;  Location: [facility];  Service: General;  Laterality: N/A;  . CHOLECYSTECTOMY  02/14/2016  . TUBAL LIGATION  11-27-2014  . WISDOM TOOTH EXTRACTION      Family Psychiatric History: Reviewed.  Family History:  Family History  Problem Relation Age of Onset  . Hypertension Mother   . Colon polyps Mother   . Diabetes Mother   . Heart disease Mother   . Depression Mother   . Congestive Heart Failure Mother   . Dementia Mother   . Colon cancer Maternal Grandmother   . ADD / ADHD Daughter   . Asperger's syndrome Daughter   . Heart disease Maternal Grandfather   . Alzheimer's disease Paternal Grandmother     Social History:  Social History   Socioeconomic History  . Marital status: Widowed    Spouse name: Not on file  . Number of children: 3  . Years of education: Not on file  . Highest education level: Not on file  Occupational History  . Not on file  Social Needs  . Financial resource strain: Not on file  . Food insecurity:    Worry: Not on file    Inability: Not on file  . Transportation needs:    Medical: Not on file    Non-medical: Not on file  Tobacco Use  . Smoking status: Never Smoker  . Smokeless tobacco: Never Used  Substance and Sexual Activity  . Alcohol use: Never  Alcohol/week: 0.0 standard drinks    Frequency: Never  . Drug use: Never  . Sexual activity: Not on file  Lifestyle  . Physical activity:    Days per week: Not on file    Minutes per session: Not on file  . Stress: Not on file  Relationships  . Social connections:    Talks on phone: Not on file    Gets together: Not on file    Attends religious service: Not on file    Active member of club or organization: Not on file    Attends meetings of clubs or organizations: Not on file    Relationship status: Not on file  Other Topics Concern  . Not on file  Social History Narrative   Lives at home with her 3 children   Right handed   Caffeine: very rare     Allergies:  Allergies  Allergen Reactions  . Tape Rash    Metabolic Disorder Labs: No results found for: HGBA1C, MPG No results found for: PROLACTIN No results found for: CHOL, TRIG, HDL, CHOLHDL, VLDL, LDLCALC Lab Results  Component Value Date   TSH 1.038 06/01/2018    Therapeutic Level Labs: No results found for: LITHIUM No results found for: VALPROATE No components found for:  CBMZ  Current Medications: Current Outpatient Medications  Medication Sig Dispense Refill  . buPROPion (WELLBUTRIN SR) 150 MG 12 hr tablet Take 1 tablet (150 mg total) by mouth 2 (two) times daily. 180 tablet 0  . diazepam (VALIUM) 2 MG tablet Take 1 tablet (2 mg total) by mouth every 12 (twelve) hours as needed for anxiety. 30 tablet 2  . lisinopril (PRINIVIL,ZESTRIL) 20 MG tablet Take 20 mg by mouth daily.    . montelukast (SINGULAIR) 10 MG tablet Take 10 mg by mouth at bedtime.    . pramipexole (MIRAPEX) 1 MG tablet Take 1 tablet (1 mg total) by mouth at bedtime. Reported on 06/09/2015 90 tablet 0  . traZODone (DESYREL) 50 MG tablet Take 1 tablet (50 mg total) by mouth at bedtime as needed for sleep. 90 tablet 0  . vortioxetine HBr (TRINTELLIX) 20 MG TABS tablet Take 1 tablet (20 mg total) by mouth daily. 30 tablet 2   No current facility-administered medications for this visit.      Psychiatric Specialty Exam: Review of Systems  Neurological: Positive for weakness.  Psychiatric/Behavioral: Positive for depression. The patient is nervous/anxious and has insomnia.   All other systems reviewed and are negative.   There were no vitals taken for this visit.There is no height or weight on file to calculate BMI.  General Appearance: NA  Eye Contact:  NA  Speech:  Clear and Coherent  Volume:  Normal  Mood:  Anxious and Depressed  Affect:  NA  Thought Process:  Goal Directed  Orientation:  Full (Time, Place, and Person)  Thought Content: Logical   Suicidal Thoughts:  No  Homicidal  Thoughts:  No  Memory:  Immediate;   Good Recent;   Good Remote;   Good  Judgement:  Intact  Insight:  Good  Psychomotor Activity:  NA  Concentration:  Concentration: Good  Recall:  Good  Fund of Knowledge: Good  Language: Good  Akathisia:  NA  Handed:  Right  AIMS (if indicated): not done  Assets:  Communication Skills Desire for Improvement Financial Resources/Insurance Resilience  ADL's:  Intact  Cognition: WNL  Sleep:  Good   Screenings: Mini-Mental     Office Visit from 07/22/2018 in  [facility]  Total Score (max 30 points )  26       Assessment and Plan: 43 yo widowed female with major depression and anxiety (excessive worrying, occasional panic attacks) since loss of her father In 2003. She also lost her husband to ALS 16 months ago and is still grieving. She reports feeling less depressed and less anxious after addition of Trintellix. Rare panic attacks, sleep improved and so did concentration and energy (Wellbutrin dose was increased to 150 mg bid). She takes trazodone for insomnia and diazepam 2 mgas needed for anxiety. She started counseling (with [facility]). She will have neurology consultation at [facility] for intermittent weakness on April 17th.  Plan: Given clear improvement in mood but not remission of sx we will increase dose of Trintellix to 20 mg and continue other medications unchanged (Wellbutrin, trazodone, diazepam). She will continue counseling and will return for next medication management visit in 3 months.    Stephanie Acre, MD 08/22/2018, 8:41 AM

## 2018-08-30 ENCOUNTER — Encounter (HOSPITAL_COMMUNITY): Payer: Self-pay | Admitting: Psychiatry

## 2018-08-30 ENCOUNTER — Other Ambulatory Visit: Payer: Self-pay

## 2018-08-30 ENCOUNTER — Ambulatory Visit (INDEPENDENT_AMBULATORY_CARE_PROVIDER_SITE_OTHER): Payer: 59 | Admitting: Psychiatry

## 2018-08-30 DIAGNOSIS — F411 Generalized anxiety disorder: Secondary | ICD-10-CM

## 2018-08-30 DIAGNOSIS — F331 Major depressive disorder, recurrent, moderate: Secondary | ICD-10-CM

## 2018-08-30 NOTE — Progress Notes (Signed)
Virtual Visit via Video Note  I connected with Leslie Robinson on 08/30/18 at  8:30 AM EDT by a video enabled telemedicine application and verified that I am speaking with the correct person using two identifiers.   I discussed the limitations of evaluation and management by telemedicine and the availability of in person appointments. The patient expressed understanding and agreed to proceed.  History of Present Illness: GAD and MDD due to medical conditions and loss of husband.    Observations/Objective: Counselor met with Leslie Robinson via Webex (she was in [place] during session) for individual therapy. Counselor assessed progress on goals since initial session. Counselor assessed mental health symptoms and life stressors for [name] shared that she has been experiencing anxiety from the lack of answers about her medical condition, unsure able disability status with work and the invalidation she feels from her support system. Counselor processed thoughts and feelings and assessed the severity of her anxiety. Counselor and Leslie Robinson discussed CBT skills to reduce some of her anxiety. Counselor provided psychoeducation on grief and loss and we discussed the important anniversaries of grieving her husbands death. Counselor explored strengths within her support system and the coping skills she finds helpful. Counselor summarized session and made a plan to meet again in 2 weeks.   Assessment and Plan: Leslie Robinson would like to continue meeting via Webex EOW. Counselor encouraged her to use communication skills to reduce her anxiety.   Follow Up Instructions: Counselor will set up Webex for next session.    I discussed the assessment and treatment plan with the patient. The patient was provided an opportunity to ask questions and all were answered. The patient agreed with the plan and demonstrated an understanding of the instructions.   The patient was advised to call back or seek an in-person evaluation if the symptoms worsen or  if the condition fails to improve as anticipated.  I provided 55 minutes of non-face-to-face time during this encounter.   [name], LCSW

## 2018-09-05 ENCOUNTER — Encounter: Payer: Managed Care, Other (non HMO) | Admitting: Neurology

## 2018-09-12 ENCOUNTER — Encounter: Payer: Self-pay | Admitting: Neurology

## 2018-09-13 ENCOUNTER — Ambulatory Visit (INDEPENDENT_AMBULATORY_CARE_PROVIDER_SITE_OTHER): Payer: 59 | Admitting: Psychiatry

## 2018-09-13 ENCOUNTER — Other Ambulatory Visit: Payer: Self-pay

## 2018-09-13 ENCOUNTER — Encounter (HOSPITAL_COMMUNITY): Payer: Self-pay | Admitting: Psychiatry

## 2018-09-13 DIAGNOSIS — F331 Major depressive disorder, recurrent, moderate: Secondary | ICD-10-CM | POA: Diagnosis not present

## 2018-09-13 DIAGNOSIS — F064 Anxiety disorder due to known physiological condition: Secondary | ICD-10-CM | POA: Diagnosis not present

## 2018-09-13 DIAGNOSIS — F411 Generalized anxiety disorder: Secondary | ICD-10-CM | POA: Diagnosis not present

## 2018-09-13 NOTE — Progress Notes (Signed)
Virtual Visit via Telephone Note  I connected with Leslie Robinson on 09/13/18 at  8:30 AM EDT by telephone and verified that I am speaking with the correct person using two identifiers.   I discussed the limitations, risks, security and privacy concerns of performing an evaluation and management service by telephone and the availability of in person appointments. Leslie Robinson is aware that I am not licensed in the state of [place], so she has agreed to travel to [facility] territory to receive treatment virtually.  I also discussed with the patient that there may be a patient responsible charge related to this service. The patient expressed understanding and agreed to proceed.  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   Observations/Objective: Counselor met with Leslie Robinson over the phone for individual therapy. Counselor assessed mental health symptoms and life stressors with Leslie Robinson. Leslie Robinson reported high anxiety and disappointment over her doctors appointment/medical testing being rescheduled due to COVID-19 related issues. Counselor processed her disappointment, with a focus on the aspects she can control and coping skills she can utilize. Counselor processed concerns and progress within her family unit and support system. Leslie Robinson reports that overall they are coping well with all the changes and challenges with her health. Leslie Robinson reported experiencing another mini-stroke and we discussed safety measures she may want to consider to have supports in place for those instances.   Assessment and Plan: Counselor will continue meeting with Leslie Robinson to address treatment plan goals. Leslie Robinson will follow up with medical professionals to address concerns with physical health.   Follow Up Instructions: Counselor will set up Webex for next session.    I discussed the assessment and treatment plan with the patient. The patient was provided an opportunity to  ask questions and all were answered. The patient agreed with the plan and demonstrated an understanding of the instructions.   The patient was advised to call back or seek an in-person evaluation if the symptoms worsen or if the condition fails to improve as anticipated.  I provided 45 minutes of non-face-to-face time during this encounter.   Leslie Auer, LCSW

## 2018-09-27 ENCOUNTER — Encounter (HOSPITAL_COMMUNITY): Payer: Self-pay | Admitting: Psychiatry

## 2018-09-27 ENCOUNTER — Ambulatory Visit (INDEPENDENT_AMBULATORY_CARE_PROVIDER_SITE_OTHER): Payer: 59 | Admitting: Psychiatry

## 2018-09-27 ENCOUNTER — Other Ambulatory Visit: Payer: Self-pay

## 2018-09-27 DIAGNOSIS — R413 Other amnesia: Secondary | ICD-10-CM | POA: Diagnosis not present

## 2018-09-27 DIAGNOSIS — F064 Anxiety disorder due to known physiological condition: Secondary | ICD-10-CM

## 2018-09-27 NOTE — Progress Notes (Signed)
Virtual Visit via Video Note  I connected with Leslie Robinson on 09/27/18 at  8:30 AM EDT by a video enabled telemedicine application and verified that I am speaking with the correct person using two identifiers.  Location: Patient: Leslie Robinson Provider: [name], LCSW   I discussed the limitations of evaluation and management by telemedicine and the availability of in person appointments. The patient expressed understanding and agreed to proceed.  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 Adverse life experiences and grief and loss issues.    Observations/Objective: Counselor met with Leslie Robinson for individual therapy via Webex. Counselor assessed MH symptoms and progress on treatment plan goals. Leslie Robinson shared that she is continuing to have seizure like episodes when over exerted. She reported experiencing one last night and feeling extremely exhausted today. It caused her limps to disable and for her to fall. She reports having more memory issues and the inability to complete tasks that use to be easy for her. We discussed her safety and reporting symptoms accurately to providers. We processed her emotions connected with the medical difficulties and uncertainties. We explored her family history and expression of emotions. Counselor explored feelings associates with upcoming birthday and Mothers Day.  Leslie Robinson denied suicidal ideation or self-harm  Assessment and Plan: Counselor will continue to meet with Leslie Robinson to address treatment plan goals. Leslie Robinson will continue to follow recommendations of providers and implement skills learned in session.  Follow Up Instructions: Counselor will send information for next session via Webex.    I discussed the assessment and treatment plan with the patient. The patient was provided an opportunity to ask questions and all were answered. The patient agreed with the plan and demonstrated an understanding of the  instructions.   The patient was advised to call back or seek an in-person evaluation if the symptoms worsen or if the condition fails to improve as anticipated.  I provided 50 minutes of non-face-to-face time during this encounter.   [name], LCSW

## 2018-10-11 ENCOUNTER — Other Ambulatory Visit: Payer: Self-pay

## 2018-10-11 ENCOUNTER — Encounter (HOSPITAL_COMMUNITY): Payer: Self-pay | Admitting: Psychiatry

## 2018-10-11 ENCOUNTER — Ambulatory Visit (INDEPENDENT_AMBULATORY_CARE_PROVIDER_SITE_OTHER): Payer: 59 | Admitting: Psychiatry

## 2018-10-11 DIAGNOSIS — R413 Other amnesia: Secondary | ICD-10-CM | POA: Diagnosis not present

## 2018-10-11 DIAGNOSIS — F064 Anxiety disorder due to known physiological condition: Secondary | ICD-10-CM

## 2018-10-11 NOTE — Progress Notes (Signed)
Virtual Visit via Video Note  I connected with Leslie Robinson on 10/11/18 at  8:30 AM EDT by a video enabled telemedicine application and verified that I am speaking with the correct person using two identifiers.  Location: Patient: Leslie Robinson Provider:  , LCSW   I discussed the limitations of evaluation and management by telemedicine and the availability of in person appointments. The patient expressed understanding and agreed to proceed.  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   Observations/Objective: Counselor met with Leslie Robinson for individual therapy via telephone. Counselor assessed MH symptoms and progress on treatment plan goals. Leslie Robinson denied suicidal ideation or self-harm behaviors. However, Leslie Robinson shared that she has been having more negative cognition and questioning her situation. She experienced one dizzy spell in the past two weeks, but stated that she is learning to read her body better and has set limits on physical therapy. Counselor assessed her memory problems and we discussed ideas on how to better manage the memory challenges. Leslie Robinson sounded interested in trying the skills and sharing the ideas with her support system to assist as well. Counselor and Leslie Robinson processed emotions and discussed family dynamics and upcoming events. Leslie Robinson was most excited to know that she is back on schedule to meet with specialists about her condition.    Assessment and Plan: Counselor will continue to meet with Leslie Robinson to address treatment plan goals. Leslie Robinson will continue to follow recommendations of providers and implement skills learned in session.  Follow Up Instructions: Counselor will send information for next session via Webex.   I discussed the assessment and treatment plan with the patient. The patient was provided an opportunity to ask questions and all were answered. The patient agreed with the plan and  demonstrated an understanding of the instructions.   The patient was advised to call back or seek an in-person evaluation if the symptoms worsen or if the condition fails to improve as anticipated.  I provided 35 minutes of non-face-to-face time during this encounter.    , LCSW  

## 2018-10-21 DIAGNOSIS — F447 Conversion disorder with mixed symptom presentation: Secondary | ICD-10-CM | POA: Insufficient documentation

## 2018-10-25 ENCOUNTER — Ambulatory Visit (HOSPITAL_COMMUNITY): Payer: 59 | Admitting: Psychiatry

## 2018-11-08 ENCOUNTER — Other Ambulatory Visit: Payer: Self-pay

## 2018-11-08 ENCOUNTER — Encounter (HOSPITAL_COMMUNITY): Payer: Self-pay | Admitting: Psychiatry

## 2018-11-08 ENCOUNTER — Ambulatory Visit (INDEPENDENT_AMBULATORY_CARE_PROVIDER_SITE_OTHER): Payer: 59 | Admitting: Psychiatry

## 2018-11-08 DIAGNOSIS — R413 Other amnesia: Secondary | ICD-10-CM

## 2018-11-08 DIAGNOSIS — F064 Anxiety disorder due to known physiological condition: Secondary | ICD-10-CM | POA: Diagnosis not present

## 2018-11-08 DIAGNOSIS — F0631 Mood disorder due to known physiological condition with depressive features: Secondary | ICD-10-CM

## 2018-11-08 DIAGNOSIS — F445 Conversion disorder with seizures or convulsions: Secondary | ICD-10-CM

## 2018-11-08 NOTE — Progress Notes (Signed)
Virtual Visit via Video Note  I connected with Leslie Robinson on 11/08/18 at  9:00 AM EDT by a video enabled telemedicine application and verified that I am speaking with the correct person using two identifiers.  Location: Patient: Leslie Robinson Provider: [name], LCSW   I discussed the limitations of evaluation and management by telemedicine and the availability of in person appointments. The patient expressed understanding and agreed to proceed.  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    Observations/Objective: Counselor met with Leslie Robinson for individual therapy via Webex. Counselor assessed MH symptoms and progress on treatment plan goals. Leslie Robinson denied suicidal ideation or self-harm behaviors. Leslie Robinson shared that she had just experienced 3 very difficult weeks regarding her health. Counselor explored the stressors. Leslie Robinson shared that she had been hospitalized and transitioned to a Nursing home due to another "storke-like" attack that caused paralyses, confusion, memory issues, vision limitations, etc. She reported that despite having all the symptoms of a Stroke, the scan are still showing that there was no Stroke. 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 Counselor and Leslie Robinson identified ways in which her life has been impacted by her frequent attacks and what causes her body to respond in that way. We discussed treatment options and determined that at this point it would be unsafe for her to return to work, due to her inability to drive, her lack of daily transportation provided by others, her memory and cognative functioing (issues with sequencing, retention, delayed responses, confusion of numbers and words, and difficulty with following instructions in order). Counselor is concerned about her financial situation due to income  stopping since she has been out of work since Jan. She reports high medical debt and no way of earning an income at this point. Counselor and Leslie Robinson discussed community resources and governmental programs that could be of assistance, as well as following up with HR and Disability Services. Counselor will communicate most recent concerns with Psychiatrist so he will be prepared to address medication issues at next appointment.   Assessment and Plan: Counselor will continue to meet with Leslie Robinson to address treatment plan goals. Leslie Robinson will continue to follow recommendations of providers and implement skills learned in session.  Follow Up Instructions: Counselor will send information for next session via Webex.     I discussed the assessment and treatment plan with the patient. The patient was provided an opportunity to ask questions and all were answered. The patient agreed with the plan and demonstrated an understanding of the instructions.   The patient was advised to call back or seek an in-person evaluation if the symptoms worsen or if the condition fails to improve as anticipated.  I provided 55 minutes of non-face-to-face time during this encounter.   [name], LCSW

## 2018-11-20 ENCOUNTER — Other Ambulatory Visit: Payer: Self-pay

## 2018-11-20 ENCOUNTER — Ambulatory Visit (INDEPENDENT_AMBULATORY_CARE_PROVIDER_SITE_OTHER): Payer: 59 | Admitting: Psychiatry

## 2018-11-20 DIAGNOSIS — F3341 Major depressive disorder, recurrent, in partial remission: Secondary | ICD-10-CM

## 2018-11-20 DIAGNOSIS — F411 Generalized anxiety disorder: Secondary | ICD-10-CM

## 2018-11-20 DIAGNOSIS — F447 Conversion disorder with mixed symptom presentation: Secondary | ICD-10-CM

## 2018-11-20 MED ORDER — BUPROPION HCL ER (SR) 150 MG PO TB12: 150.0000 mg | ORAL_TABLET | Freq: Two times a day (BID) | ORAL | 0 refills | Status: DC

## 2018-11-20 MED ORDER — DIAZEPAM 2 MG PO TABS: 2.0000 mg | ORAL_TABLET | Freq: Three times a day (TID) | ORAL | 2 refills | Status: AC | PRN

## 2018-11-20 MED ORDER — TRAZODONE HCL 50 MG PO TABS: 50.0000 mg | ORAL_TABLET | Freq: Every evening | ORAL | 0 refills | Status: DC | PRN

## 2018-11-20 MED ORDER — VORTIOXETINE HBR 20 MG PO TABS: 20.0000 mg | ORAL_TABLET | Freq: Every day | ORAL | 0 refills | Status: AC

## 2018-11-20 NOTE — Progress Notes (Addendum)
[place] MD/PA/NP OP Progress Note  11/20/2018 9:23 AM Leslie Robinson  MRN:  [id number] Interview was conducted by phone and I verified that I was speaking with the correct person using two identifiers. I discussed the limitations of evaluation and management by telemedicine and  the availability of in person appointments. Patient expressed understanding and agreed to proceed.  Chief Complaint: Anxiety, weight gain  HPI: 43 yo widowed female with majordepression and anxiety (excessive worrying, occasional panic attacks) since loss of her father In 2003. She also lost her husband to ALS 16 months ago and is still grieving. She reports feeling much less depressed and less anxious after addition of Trintellix. Rare panic attacks, sleep improved and so did concentration and energy (Wellbutrin dose was increased to 150 mg bid). She takes trazodone for insomnia and diazepam 2 mg as needed for anxiety.She started counseling (with [facility]). She was seen by [facility] for episodic spells of weakness, numbness, speech slurring and headache and after workup was negative informed that these are in all likely hood psychogenic in nature (converion episodes). She has accepted this explanation well. She is concerned about weight increase by 5 lbs over last two months - it is possibly due to Trintellix dose of which was increased in April.    Visit Diagnosis:    ICD-10-CM   1. GAD (generalized anxiety disorder)  F41.1   2. Major depressive disorder, recurrent episode, in partial remission ([place])  F33.41   3. 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  Past Medical History:  Past Medical History:  Diagnosis Date  . Anemia   . Anxiety   . Asthma    childhood  . Depression   . Fatty liver   . Gallstones   . Gastric ulcer   . GERD (gastroesophageal reflux disease)   . Gestational diabetes   . Heart murmur    "nothing to woorry about"   . Hiatal hernia   . Iron deficiency anemia   . Pneumonia 2017  . Restless leg   . Shingles   . Shortness of breath dyspnea    with exertion  . UTI (lower urinary tract infection)    frequently  . Vitamin D deficiency     Past Surgical History:  Procedure Laterality Date  . CHOLECYSTECTOMY N/A 02/14/2016   Procedure: LAPAROSCOPIC CHOLECYSTECTOMY;  Surgeon: Coralie Keens, MD;  Location: [facility];  Service: General;  Laterality: N/A;  . CHOLECYSTECTOMY  02/14/2016  . TUBAL LIGATION  11-27-2014  . WISDOM TOOTH EXTRACTION      Family Psychiatric History: Reviewed.  Family History:  Family History  Problem Relation Age of Onset  . Hypertension Mother   . Colon polyps Mother   . Diabetes Mother   . Heart disease Mother   . Depression Mother   . Congestive Heart Failure Mother   . Dementia Mother   . Colon cancer Maternal Grandmother   . ADD / ADHD Daughter   . Asperger's syndrome Daughter   . Heart disease Maternal Grandfather   . Alzheimer's disease Paternal Grandmother     Social History:  Social History   Socioeconomic History  . Marital status: Widowed    Spouse name: Not on file  . Number of children: 3  . Years of education: Not on file  . Highest education level: Not on file  Occupational History  . Not on file  Social Needs  . Financial resource strain:  Not on file  . Food insecurity    Worry: Not on file    Inability: Not on file  . Transportation needs    Medical: Not on file    Non-medical: Not on file  Tobacco Use  . Smoking status: Never Smoker  . Smokeless tobacco: Never Used  Substance and Sexual Activity  . Alcohol use: Never    Alcohol/week: 0.0 standard drinks    Frequency: Never  . Drug use: Never  . Sexual activity: Not on file  Lifestyle  . Physical activity    Days per week: Not on file    Minutes per session: Not on file  . Stress: Not on file  Relationships  . Social Herbalist on phone: Not on file    Gets together:  Not on file    Attends religious service: Not on file    Active member of club or organization: Not on file    Attends meetings of clubs or organizations: Not on file    Relationship status: Not on file  Other Topics Concern  . Not on file  Social History Narrative   Lives at home with her 3 children   Right handed   Caffeine: very rare    Allergies:  Allergies  Allergen Reactions  . Tape Rash    Metabolic Disorder Labs: No results found for: HGBA1C, MPG No results found for: PROLACTIN No results found for: CHOL, TRIG, HDL, CHOLHDL, VLDL, LDLCALC Lab Results  Component Value Date   TSH 1.038 06/01/2018    Therapeutic Level Labs: No results found for: LITHIUM No results found for: VALPROATE No components found for:  CBMZ  Current Medications: Current Outpatient Medications  Medication Sig Dispense Refill  . buPROPion (WELLBUTRIN SR) 150 MG 12 hr tablet Take 1 tablet (150 mg total) by mouth 2 (two) times daily. 180 tablet 0  . diazepam (VALIUM) 2 MG tablet Take 1 tablet (2 mg total) by mouth every 8 (eight) hours as needed for anxiety. 90 tablet 2  . lisinopril (PRINIVIL,ZESTRIL) 20 MG tablet Take 20 mg by mouth daily.    . montelukast (SINGULAIR) 10 MG tablet Take 10 mg by mouth at bedtime.    . pramipexole (MIRAPEX) 1 MG tablet Take 1 tablet (1 mg total) by mouth at bedtime. Reported on 06/09/2015 90 tablet 0  . traZODone (DESYREL) 50 MG tablet Take 1 tablet (50 mg total) by mouth at bedtime as needed for sleep. 90 tablet 0  . vortioxetine HBr (TRINTELLIX) 20 MG TABS tablet Take 1 tablet (20 mg total) by mouth daily. 90 tablet 0   No current facility-administered medications for this visit.     Psychiatric Specialty Exam: Review of Systems  Constitutional:       Weight gain  Psychiatric/Behavioral: The patient is nervous/anxious.     There were no vitals taken for this visit.There is no height or weight on file to calculate BMI.  General Appearance: NA  Eye  Contact:  NA  Speech:  Clear and Coherent and Normal Rate  Volume:  Normal  Mood:  Mild anxiety  Affect:  NA  Thought Process:  Goal Directed  Orientation:  Full (Time, Place, and Person)  Thought Content: Logical   Suicidal Thoughts:  No  Homicidal Thoughts:  No  Memory:  Immediate;   Good Recent;   Good Remote;   Good  Judgement:  Good  Insight:  Fair  Psychomotor Activity:  NA  Concentration:  Concentration: Good  Recall:  Good  Fund of Knowledge: Good  Language: Good  Akathisia:  Negative  Handed:  Right  AIMS (if indicated): not done  Assets:  Communication Skills Desire for Improvement Financial Resources/Insurance Housing Leisure Time Resilience  ADL's:  Intact  Cognition: WNL  Sleep:  Good   Screenings: Mini-Mental     Office Visit from 07/22/2018 in [facility]  Total Score (max 30 points )  26       Assessment and Plan: 43 yo widowed female with majordepression and anxiety (excessive worrying, occasional panic attacks) since loss of her father In 2003. She also lost her husband to ALS 16 months ago and is still grieving. She reports feeling much less depressed and less anxious after addition of Trintellix. Rare panic attacks, sleep improved and so did concentration and energy (Wellbutrin dose was increased to 150 mg bid). She takes trazodone for insomnia and diazepam 2 mg as needed for anxiety.She started counseling (with [facility]). She was seen by [facility] for episodic spells of weakness, numbness, speech slurring and headache and after workup was negative informed that these are in all likely hood psychogenic in nature (converion episodes). She has accepted this explanation well. She is concerned about weight increase by 5 lbs over last two months - it is possibly due to Trintellix dose of which was increased in April.               Plan: We will try decreasing dose of Trintellix back to 10 mg to see if increased appetite does not  normalize. If her mood declines however she will increase it back to 20 mg. Other medications will remain the same (Wellbutrin, trazodone, diazepam). She will continue counseling and will return for next medication management visit in 3 months. The plan was discussed with patient who had an opportunity to ask questions and these were all answered. I spend 25 minutes in phone consultation with the patient.      Stephanie Acre, MD 11/20/2018, 9:23 AM

## 2018-11-21 ENCOUNTER — Encounter (HOSPITAL_COMMUNITY): Payer: Self-pay | Admitting: Psychiatry

## 2018-11-21 ENCOUNTER — Ambulatory Visit (INDEPENDENT_AMBULATORY_CARE_PROVIDER_SITE_OTHER): Payer: 59 | Admitting: Psychiatry

## 2018-11-21 DIAGNOSIS — F3341 Major depressive disorder, recurrent, in partial remission: Secondary | ICD-10-CM | POA: Diagnosis not present

## 2018-11-21 DIAGNOSIS — F411 Generalized anxiety disorder: Secondary | ICD-10-CM | POA: Diagnosis not present

## 2018-11-21 DIAGNOSIS — F447 Conversion disorder with mixed symptom presentation: Secondary | ICD-10-CM | POA: Diagnosis not present

## 2018-11-21 NOTE — Progress Notes (Signed)
Virtual Visit via Video Note  I connected with [name] on 11/21/18 at  1:00 PM EDT by a video enabled telemedicine application and verified that I am speaking with the correct person using two identifiers.  Location: Patient: Leslie Robinson Provider: [name], LCSW   I discussed the limitations of evaluation and management by telemedicine and the availability of in person appointments. The patient expressed understanding and agreed to proceed.  History of Present Illness: MDD, GAD, Conversion Disorder   Observations/Objective: Counselor met with Leslie Robinson for individual therapy via Webex. Counselor assessed MH symptoms and progress on treatment plan goals. Leslie Robinson denied suicidal ideation or self-harm behaviors. Leslie Robinson shared that this morning in her sleep she experienced another seizure like episode, which also cause her to vomit several times afterwards. She stated that this was the first sleep episode and that vomiting occurred. Counselor ensured safety and we discussed who helped her and what she needed for recovery. Counselor discussed her keeping track of episodes and symptoms on her calendar to have for accurate information to share at medical appointments. Leslie Robinson shared that she met with Dr. Mamie Nick this week and it was decided that her vallum would be increased to settle her body more. Leslie Robinson denies depression or anxiety symptoms at this time. Counselor explored if medication changes could have sparked her episode, but she had not increased the dose yet. Leslie Robinson shared that her cognitive functioning scores have decreased at speech therapy and that she is concerned by this. We problem solved on how to prepare for an upcoming event that could spark another episode. Counselor and Leslie Robinson decided to end the session early so Leslie Robinson could rest and recover, but Counselor expressed concern and ongoing support in the process of determining the root cause of the episodes.   Assessment and Plan: Counselor will continue to meet  with Leslie Robinson to address treatment plan goals. Leslie Robinson will continue to follow recommendations of providers and implement skills learned in session.  Follow Up Instructions: Counselor will send information for next session via Webex.    The patient was advised to call back or seek an in-person evaluation if the symptoms worsen or if the condition fails to improve as anticipated.  I provided 23 minutes of non-face-to-face time during this encounter.   [name], LCSW

## 2018-11-22 ENCOUNTER — Ambulatory Visit (HOSPITAL_COMMUNITY): Payer: 59 | Admitting: Psychiatry

## 2018-12-06 ENCOUNTER — Ambulatory Visit (INDEPENDENT_AMBULATORY_CARE_PROVIDER_SITE_OTHER): Payer: 59 | Admitting: Psychiatry

## 2018-12-06 ENCOUNTER — Encounter (HOSPITAL_COMMUNITY): Payer: Self-pay | Admitting: Psychiatry

## 2018-12-06 ENCOUNTER — Other Ambulatory Visit: Payer: Self-pay

## 2018-12-06 DIAGNOSIS — F064 Anxiety disorder due to known physiological condition: Secondary | ICD-10-CM | POA: Diagnosis not present

## 2018-12-06 DIAGNOSIS — F445 Conversion disorder with seizures or convulsions: Secondary | ICD-10-CM

## 2018-12-06 DIAGNOSIS — R413 Other amnesia: Secondary | ICD-10-CM | POA: Diagnosis not present

## 2018-12-06 NOTE — Progress Notes (Signed)
Virtual Visit via Video Note  I connected with Leslie Robinson on 12/06/18 at  9:00 AM EDT by a video enabled telemedicine application and verified that I am speaking with the correct person using two identifiers.  Location: Patient: Leslie Robinson Provider: [name], LCSW   I discussed the limitations of evaluation and management by telemedicine and the availability of in person appointments. The patient expressed understanding and agreed to proceed.  History of Present Illness: Conversion Disorder with seizures and attacks, memory difficulties due to unknown medical condition and anxiety due to medical conditions.    Observations/Objective: Counselor met with Leslie Robinson for individual therapy via Webex. Counselor assessed MH symptoms and progress on treatment plan goals. Leslie Robinson denied suicidal ideation or self-harm behaviors. Leslie Robinson shared that she had not experienced an seizure-like episode since our last call. She reported that her doctor has been able to get her blood pressure back regulated, which has made a great difference in her overall health and functioning. Counselor reviewed progress on homework with Leslie Robinson sharing that she strategically planned her daughters graduation celebration in a way that was not overwhelming, she had started the disability process, but was already faced with several challenges, and that she is keeping track of her symptoms in her calendar. Counselor and Leslie Robinson explored her frustrations with the disability process. Counselor to send resources for her to connect with in her area for assistance or advocacy. Counselor explored the loss of her husband and how it impacts the family during times of celebration, like this weekend. Leslie Robinson shared that they have found a way to honor him on that day for the love, care and encouragement he gave their daughter in her efforts in her education.   Assessment and Plan: Counselor will continue to meet with Leslie Robinson to address treatment plan goals. Leslie Robinson will  continue to follow recommendations of providers and implement skills learned in session.  Follow Up Instructions: Counselor will send information for next session via Webex.     I discussed the assessment and treatment plan with the patient. The patient was provided an opportunity to ask questions and all were answered. The patient agreed with the plan and demonstrated an understanding of the instructions.   The patient was advised to call back or seek an in-person evaluation if the symptoms worsen or if the condition fails to improve as anticipated.  I provided 40 minutes of non-face-to-face time during this encounter.   [name], LCSW

## 2018-12-20 ENCOUNTER — Ambulatory Visit (HOSPITAL_COMMUNITY): Payer: 59 | Admitting: Psychiatry

## 2018-12-23 ENCOUNTER — Ambulatory Visit (HOSPITAL_COMMUNITY): Payer: 59 | Admitting: Psychiatry

## 2019-01-03 ENCOUNTER — Ambulatory Visit (HOSPITAL_COMMUNITY): Payer: 59 | Admitting: Psychiatry

## 2019-01-17 ENCOUNTER — Other Ambulatory Visit: Payer: Self-pay

## 2019-01-17 ENCOUNTER — Ambulatory Visit (HOSPITAL_COMMUNITY): Payer: 59 | Admitting: Psychiatry

## 2019-02-20 ENCOUNTER — Other Ambulatory Visit: Payer: Self-pay

## 2019-02-20 ENCOUNTER — Ambulatory Visit (INDEPENDENT_AMBULATORY_CARE_PROVIDER_SITE_OTHER): Payer: 59 | Admitting: Psychiatry

## 2019-02-20 DIAGNOSIS — F330 Major depressive disorder, recurrent, mild: Secondary | ICD-10-CM | POA: Diagnosis not present

## 2019-02-20 DIAGNOSIS — F411 Generalized anxiety disorder: Secondary | ICD-10-CM | POA: Diagnosis not present

## 2019-02-20 DIAGNOSIS — F447 Conversion disorder with mixed symptom presentation: Secondary | ICD-10-CM | POA: Diagnosis not present

## 2019-02-20 MED ORDER — VORTIOXETINE HBR 20 MG PO TABS: 20.0000 mg | ORAL_TABLET | Freq: Every day | ORAL | 2 refills | Status: DC

## 2019-02-20 MED ORDER — TRAZODONE HCL 50 MG PO TABS: 50.0000 mg | ORAL_TABLET | Freq: Every evening | ORAL | 0 refills | Status: DC | PRN

## 2019-02-20 MED ORDER — BUPROPION HCL ER (SR) 200 MG PO TB12: 200.0000 mg | ORAL_TABLET | Freq: Two times a day (BID) | ORAL | 0 refills | Status: DC

## 2019-02-20 MED ORDER — DIAZEPAM 2 MG PO TABS: 2.0000 mg | ORAL_TABLET | Freq: Three times a day (TID) | ORAL | 2 refills | Status: DC | PRN

## 2019-02-20 NOTE — Progress Notes (Signed)
[place] MD/PA/NP OP Progress Note  02/20/2019 9:17 AM Leslie Robinson  MRN:  [id number] Interview was conducted by phone and I verified that I was speaking with the correct person using two identifiers. I discussed the limitations of evaluation and management by telemedicine and  the availability of in person appointments. Patient expressed understanding and agreed to proceed.  Chief Complaint: Increased depression.  HPI: 43 yo widowed femalewith majordepression and anxiety (excessive worrying, occasional panic attacks) since loss of her father In 2003. She also lost her husband to ALS 16 months ago and is still grieving. She reportsfeeling more depressed over last two weeks. Initial good response to  Trintellix. Rarepanic attacks, sleep improved and so didconcentrationand energy (Wellbutrin dose was increased to 150 mg bid). She takestrazodone for insomnia and diazepam 2 mg as needed for anxiety. She was seen by [facility]rology for episodic spells of weakness, numbness, speech slurring and headache and after workup was negative informed that these are in all likely hood psychogenic in nature (converion episodes). She has accepted this explanation well. She had another of these episodes few days ago with residual pronounced fatigue.  Visit Diagnosis:    ICD-10-CM   1. GAD (generalized anxiety disorder)  F41.1   2. Major depressive disorder, recurrent episode, mild (HCC)  F33.0   3. 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  Past Medical History:  Past Medical History:  Diagnosis Date  . Anemia   . Anxiety   . Asthma    childhood  . Depression   . Fatty liver   . Gallstones   . Gastric ulcer   . GERD (gastroesophageal reflux disease)   . Gestational diabetes   . Heart murmur    "nothing to woorry about"  . Hiatal hernia   . Iron deficiency anemia   . Pneumonia 2017  . Restless leg   . Shingles   . Shortness  of breath dyspnea    with exertion  . UTI (lower urinary tract infection)    frequently  . Vitamin D deficiency     Past Surgical History:  Procedure Laterality Date  . CHOLECYSTECTOMY N/A 02/14/2016   Procedure: LAPAROSCOPIC CHOLECYSTECTOMY;  Surgeon: Coralie Keens, MD;  Location: [place];  Service: General;  Laterality: N/A;  . CHOLECYSTECTOMY  02/14/2016  . TUBAL LIGATION  11-27-2014  . WISDOM TOOTH EXTRACTION      Family Psychiatric History: Reviewed.  Family History:  Family History  Problem Relation Age of Onset  . Hypertension Mother   . Colon polyps Mother   . Diabetes Mother   . Heart disease Mother   . Depression Mother   . Congestive Heart Failure Mother   . Dementia Mother   . Colon cancer Maternal Grandmother   . ADD / ADHD Daughter   . Asperger's syndrome Daughter   . Heart disease Maternal Grandfather   . Alzheimer's disease Paternal Grandmother     Social History:  Social History   Socioeconomic History  . Marital status: Widowed    Spouse name: Not on file  . Number of children: 3  . Years of education: Not on file  . Highest education level: Not on file  Occupational History  . Not on file  Social Needs  . Financial resource strain: Not on file  . Food insecurity    Worry: Not on file    Inability: Not on file  . Transportation needs  Medical: Not on file    Non-medical: Not on file  Tobacco Use  . Smoking status: Never Smoker  . Smokeless tobacco: Never Used  Substance and Sexual Activity  . Alcohol use: Never    Alcohol/week: 0.0 standard drinks    Frequency: Never  . Drug use: Never  . Sexual activity: Not on file  Lifestyle  . Physical activity    Days per week: Not on file    Minutes per session: Not on file  . Stress: Not on file  Relationships  . Social Herbalist on phone: Not on file    Gets together: Not on file    Attends religious service: Not on file    Active member of club or organization: Not on file     Attends meetings of clubs or organizations: Not on file    Relationship status: Not on file  Other Topics Concern  . Not on file  Social History Narrative   Lives at home with her 3 children   Right handed   Caffeine: very rare    Allergies:  Allergies  Allergen Reactions  . Tape Rash    Metabolic Disorder Labs: No results found for: HGBA1C, MPG No results found for: PROLACTIN No results found for: CHOL, TRIG, HDL, CHOLHDL, VLDL, LDLCALC Lab Results  Component Value Date   TSH 1.038 06/01/2018    Therapeutic Level Labs: No results found for: LITHIUM No results found for: VALPROATE No components found for:  CBMZ  Current Medications: Current Outpatient Medications  Medication Sig Dispense Refill  . diazepam (VALIUM) 2 MG tablet Take 2 mg by mouth 3 (three) times daily as needed.    . vortioxetine HBr (TRINTELLIX) 20 MG TABS tablet Take 20 mg by mouth daily.    Marland Kitchen buPROPion (WELLBUTRIN SR) 150 MG 12 hr tablet Take 1 tablet (150 mg total) by mouth 2 (two) times daily. 180 tablet 0  . lisinopril (PRINIVIL,ZESTRIL) 20 MG tablet Take 20 mg by mouth daily.    . montelukast (SINGULAIR) 10 MG tablet Take 10 mg by mouth at bedtime.    . pramipexole (MIRAPEX) 1 MG tablet Take 1 tablet (1 mg total) by mouth at bedtime. Reported on 06/09/2015 90 tablet 0  . traZODone (DESYREL) 50 MG tablet Take 1 tablet (50 mg total) by mouth at bedtime as needed for sleep. 90 tablet 0   No current facility-administered medications for this visit.      Psychiatric Specialty Exam: Review of Systems  Constitutional: Positive for malaise/fatigue.  Psychiatric/Behavioral: Positive for depression. The patient is nervous/anxious.   All other systems reviewed and are negative.   There were no vitals taken for this visit.There is no height or weight on file to calculate BMI.  General Appearance: NA  Eye Contact:  NA  Speech:  Clear and Coherent and Normal Rate  Volume:  Normal  Mood:  Anxious and  Depressed  Affect:  NA  Thought Process:  Goal Directed and Linear  Orientation:  Full (Time, Place, and Person)  Thought Content: Logical   Suicidal Thoughts:  No  Homicidal Thoughts:  No  Memory:  Immediate;   Good Recent;   Good Remote;   Good  Judgement:  Good  Insight:  Fair  Psychomotor Activity:  NA  Concentration:  Concentration: Good  Recall:  Good  Fund of Knowledge: Good  Language: Good  Akathisia:  Negative  Handed:  Right  AIMS (if indicated): not done  Assets:  Communication  Skills Desire for Improvement Financial Resources/Insurance Housing Resilience  ADL's:  Intact  Cognition: WNL  Sleep:  Good   Screenings: Mini-Mental     Office Visit from 07/22/2018 in [facility]  Total Score (max 30 points )  26       Assessment and Plan: 43 yo widowed femalewith majordepression and anxiety (excessive worrying, occasional panic attacks) since loss of her father In 2003. She also lost her husband to ALS 16 months ago and is still grieving. She reportsfeeling more depressed over last two weeks. Initial good response to  Trintellix. Rarepanic attacks, sleep improved and so didconcentrationand energy (Wellbutrin dose was increased to 150 mg bid). She takestrazodone for insomnia and diazepam 2 mg as needed for anxiety. She was seen by [facility] for episodic spells of weakness, numbness, speech slurring and headache and after workup was negative informed that these are in all likely hood psychogenic in nature (converion episodes). She has accepted this explanation well. She had another of these episodes few days ago with residual pronounced fatigue. Patient is applying for disability.  Dx: MDD recurrent mild; GAD; Conversion disorder    Plan: We will continue Trintellix 20 mg , trazodone 50 g prn insomnia and diazepam 2 mg tid prn anxiety. We will increase Wellbutrin SR to 200 mg bid. She was in counseling with [name] but had  no sessions since July. Next medication management visit in 5 weeks. The plan was discussed with patient who had an opportunity to ask questions and these were all answered. I spend 25 minutes in phone consultation with the patient.     Stephanie Acre, MD 02/20/2019, 9:17 AM

## 2019-02-24 ENCOUNTER — Encounter (HOSPITAL_COMMUNITY): Payer: Self-pay | Admitting: Psychiatry

## 2019-02-24 ENCOUNTER — Ambulatory Visit (INDEPENDENT_AMBULATORY_CARE_PROVIDER_SITE_OTHER): Payer: 59 | Admitting: Psychiatry

## 2019-02-24 ENCOUNTER — Other Ambulatory Visit: Payer: Self-pay

## 2019-02-24 DIAGNOSIS — F064 Anxiety disorder due to known physiological condition: Secondary | ICD-10-CM | POA: Diagnosis not present

## 2019-02-24 DIAGNOSIS — Z634 Disappearance and death of family member: Secondary | ICD-10-CM

## 2019-02-24 DIAGNOSIS — R413 Other amnesia: Secondary | ICD-10-CM | POA: Diagnosis not present

## 2019-02-24 NOTE — Progress Notes (Signed)
Virtual Visit via Telephone Note  I connected with Leslie Robinson on 02/24/19 at  2:00 PM EDT by telephone and verified that I am speaking with the correct person using two identifiers.  Location: Patient: Leslie Robinson Provider:  , LCSW   I discussed the limitations, risks, security and privacy concerns of performing an evaluation and management service by telephone and the availability of in person appointments. I also discussed with the patient that there may be a patient responsible charge related to this service. The patient expressed understanding and agreed to proceed.   History of Present Illness: Anxiety due to medical condition, memory difficulties, and death of partner.   Observations/Objective: Counselor met with Leslie Robinson for individual therapy via Webex. Counselor assessed MH symptoms and progress on treatment plan goals. Leslie Robinson denied suicidal ideation or self-harm behaviors. Leslie Robinson shared that she continues to reach road blocks in knowing and understanding her diagnosis, as well as finding effective treatment. Counselor and Leslie Robinson explored the differences in the various mental health and psychical health diagnoses she has been given over the past year. Counselor believes that she has anxiety due to medical condition as oppose to a medical condition caused by anxiety, we explored the difference. Leslie Robinson shared that this week is the anniversary of there husband's passing. Counselor and Leslie Robinson explored grief and loss issues for herself, her children and her extended family. Counselor promoted self-expression and leaning into the feelings to release them in a healthy way. Leslie Robinson stated that she is feeling his loss more this year than last year. We explored reasons as to why this is occurring. Counselor shared resources for grief and loss support groups and counseling for her and her children.   Assessment and Plan: Counselor will continue to meet with patient to address treatment plan goals. Patient  will continue to follow recommendations of providers and implement skills learned in session.  Follow Up Instructions: Counselor will send information for next session via Webex.   I discussed the assessment and treatment plan with the patient. The patient was provided an opportunity to ask questions and all were answered. The patient agreed with the plan and demonstrated an understanding of the instructions.   The patient was advised to call back or seek an in-person evaluation if the symptoms worsen or if the condition fails to improve as anticipated.  I provided 55 minutes of non-face-to-face time during this encounter.    , LCSW 

## 2019-03-10 ENCOUNTER — Ambulatory Visit (HOSPITAL_COMMUNITY): Payer: 59 | Admitting: Psychiatry

## 2019-03-10 ENCOUNTER — Other Ambulatory Visit: Payer: Self-pay

## 2019-04-01 ENCOUNTER — Other Ambulatory Visit: Payer: Self-pay

## 2019-04-01 ENCOUNTER — Ambulatory Visit (INDEPENDENT_AMBULATORY_CARE_PROVIDER_SITE_OTHER): Payer: Managed Care, Other (non HMO) | Admitting: Psychiatry

## 2019-04-01 DIAGNOSIS — F330 Major depressive disorder, recurrent, mild: Secondary | ICD-10-CM

## 2019-04-01 DIAGNOSIS — F411 Generalized anxiety disorder: Secondary | ICD-10-CM

## 2019-04-01 MED ORDER — VORTIOXETINE HBR 20 MG PO TABS: 20.0000 mg | ORAL_TABLET | Freq: Every day | ORAL | 2 refills | Status: DC

## 2019-04-01 MED ORDER — BUPROPION HCL ER (SR) 200 MG PO TB12: 200.0000 mg | ORAL_TABLET | Freq: Two times a day (BID) | ORAL | 0 refills | Status: DC

## 2019-04-01 MED ORDER — DIAZEPAM 2 MG PO TABS: 2.0000 mg | ORAL_TABLET | Freq: Three times a day (TID) | ORAL | 2 refills | Status: DC | PRN

## 2019-04-01 MED ORDER — TRAZODONE HCL 50 MG PO TABS: 50.0000 mg | ORAL_TABLET | Freq: Every evening | ORAL | 0 refills | Status: DC | PRN

## 2019-04-01 NOTE — Progress Notes (Signed)
[facility] MD/PA/NP OP Progress Note  04/01/2019 9:12 AM Leslie Robinson  MRN:  [id number] Interview was conducted by phone and I verified that I was speaking with the correct person using two identifiers. I discussed the limitations of evaluation and management by telemedicine and  the availability of in person appointments. Patient expressed understanding and agreed to proceed.  Chief Complaint: Some anxiety.  HPI: 43yo widowed femalewith majordepression and anxiety (excessive worrying, occasional panic attacks) since loss of her father In 2003. She also lost her husband to ALS 16 months ago and is still grieving. She reportsfeelingmore depressed over last two weeks. Initial good response to  Trintellix. Rarepanic attacks, sleep improved and so didconcentrationand energy (Wellbutrin dose was increased to 150 mg bid). She takestrazodone for insomnia and diazepam 2 mgasneeded for anxiety. [facility]was [facility] by [facility] for episodic spells of weakness, numbness, speech slurring and headache and after workup was negative informed that these are in all likely hood psychogenic in nature (converion episodes). She has accepted this explanation well. She did not have any of these episodes lately. Patient has applied for disability. We have increased  SR bupropion to 200 mg bid - she reports no depression, much better energy level but perhaps some mild irritability. Still she would not like its dose to be decreased.  Visit Diagnosis:    ICD-10-CM   1. Major depressive disorder, recurrent episode, mild (HCC)  F33.0   2. GAD (generalized anxiety disorder)  F41.1     Past Psychiatric History: Please see intake H&P.  Past Medical History:  Past Medical History:  Diagnosis Date  . Anemia   . Anxiety   . Asthma    childhood  . Depression   . Fatty liver   . Gallstones   . Gastric ulcer   . GERD (gastroesophageal reflux disease)   . Gestational diabetes   . Heart murmur    "nothing to woorry  about"  . Hiatal hernia   . Iron deficiency anemia   . Pneumonia 2017  . Restless leg   . Shingles   . Shortness of breath dyspnea    with exertion  . UTI (lower urinary tract infection)    frequently  . Vitamin D deficiency     Past Surgical History:  Procedure Laterality Date  . CHOLECYSTECTOMY N/A 02/14/2016   Procedure: LAPAROSCOPIC CHOLECYSTECTOMY;  Surgeon: Coralie Keens, MD;  Location: [place];  Service: General;  Laterality: N/A;  . CHOLECYSTECTOMY  02/14/2016  . TUBAL LIGATION  11-27-2014  . WISDOM TOOTH EXTRACTION      Family Psychiatric History: Reviewed.  Family History:  Family History  Problem Relation Age of Onset  . Hypertension Mother   . Colon polyps Mother   . Diabetes Mother   . Heart disease Mother   . Depression Mother   . Congestive Heart Failure Mother   . Dementia Mother   . Colon cancer Maternal Grandmother   . ADD / ADHD Daughter   . Asperger's syndrome Daughter   . Heart disease Maternal Grandfather   . Alzheimer's disease Paternal Grandmother     Social History:  Social History   Socioeconomic History  . Marital status: Widowed    Spouse name: Not on file  . Number of children: 3  . Years of education: Not on file  . Highest education level: Not on file  Occupational History  . Not on file  Social Needs  . Financial resource strain: Not on file  . Food insecurity  Worry: Not on file    Inability: Not on file  . Transportation needs    Medical: Not on file    Non-medical: Not on file  Tobacco Use  . Smoking status: Never Smoker  . Smokeless tobacco: Never Used  Substance and Sexual Activity  . Alcohol use: Never    Alcohol/week: 0.0 standard drinks    Frequency: Never  . Drug use: Never  . Sexual activity: Not on file  Lifestyle  . Physical activity    Days per week: Not on file    Minutes per session: Not on file  . Stress: Not on file  Relationships  . Social Herbalist on phone: Not on file    Gets  together: Not on file    Attends religious service: Not on file    Active member of club or organization: Not on file    Attends meetings of clubs or organizations: Not on file    Relationship status: Not on file  Other Topics Concern  . Not on file  Social History Narrative   Lives at home with her 3 children   Right handed   Caffeine: very rare    Allergies:  Allergies  Allergen Reactions  . Tape Rash    Metabolic Disorder Labs: No results found for: HGBA1C, MPG No results found for: PROLACTIN No results found for: CHOL, TRIG, HDL, CHOLHDL, VLDL, LDLCALC Lab Results  Component Value Date   TSH 1.038 06/01/2018    Therapeutic Level Labs: No results found for: LITHIUM No results found for: VALPROATE No components found for:  CBMZ  Current Medications: Current Outpatient Medications  Medication Sig Dispense Refill  . buPROPion (WELLBUTRIN SR) 200 MG 12 hr tablet Take 1 tablet (200 mg total) by mouth 2 (two) times daily. 180 tablet 0  . diazepam (VALIUM) 2 MG tablet Take 1 tablet (2 mg total) by mouth 3 (three) times daily as needed for anxiety. 90 tablet 2  . lisinopril (PRINIVIL,ZESTRIL) 20 MG tablet Take 20 mg by mouth daily.    . montelukast (SINGULAIR) 10 MG tablet Take 10 mg by mouth at bedtime.    . pramipexole (MIRAPEX) 1 MG tablet Take 1 tablet (1 mg total) by mouth at bedtime. Reported on 06/09/2015 90 tablet 0  . traZODone (DESYREL) 50 MG tablet Take 1 tablet (50 mg total) by mouth at bedtime as needed for sleep. 90 tablet 0  . vortioxetine HBr (TRINTELLIX) 20 MG TABS tablet Take 1 tablet (20 mg total) by mouth daily. 30 tablet 2   No current facility-administered medications for this visit.      Psychiatric Specialty Exam: Review of Systems  Psychiatric/Behavioral: The patient is nervous/anxious.   All other systems reviewed and are negative.   There were no vitals taken for this visit.There is no height or weight on file to calculate BMI.  General  Appearance: NA  Eye Contact:  NA  Speech:  Clear and Coherent and Normal Rate  Volume:  Normal  Mood:  Anxious  Affect:  NA  Thought Process:  Goal Directed and Linear  Orientation:  Full (Time, Place, and Person)  Thought Content: Logical   Suicidal Thoughts:  No  Homicidal Thoughts:  No  Memory:  Immediate;   Good Recent;   Good Remote;   Good  Judgement:  Good  Insight:  Good  Psychomotor Activity:  NA  Concentration:  Concentration: Good  Recall:  Good  Fund of Knowledge: Good  Language: Good  Akathisia:  Negative  Handed:  Right  AIMS (if indicated): not done  Assets:  Communication Skills Desire for Improvement Financial Resources/Insurance Housing Resilience  ADL's:  Intact  Cognition: WNL  Sleep:  Fair   Screenings: Mini-Mental     Office Visit from 07/22/2018 in [facility]  Total Score (max 30 points )  26       Assessment and Plan: 43yo widowed femalewith majordepression and anxiety (excessive worrying, occasional panic attacks) since loss of her father In 2003. She also lost her husband to ALS 16 months ago and is still grieving. She reportsfeelingmore depressed over last two weeks. Initial good response to  Trintellix. Rarepanic attacks, sleep improved and so didconcentrationand energy (Wellbutrin dose was increased to 150 mg bid). She takestrazodone for insomnia and diazepam 2 mgasneeded for anxiety. Shewas [facility] by [facility] for episodic spells of weakness, numbness, speech slurring and headache and after workup was negative informed that these are in all likely hood psychogenic in nature (converion episodes). She has accepted this explanation well. She did not have any of these episodes lately. Patient has applied for disability. We have increased  SR bupropion to 200 mg bid - she reports no depression, much better energy level but perhaps some mild irritability. Still she would not like its dose to be decreased.  Dx: MDD  recurrent mild; GAD; Conversion disorder  Plan:We will continue Trintellix 20 mg , trazodone 50 g prn insomnia, diazepam 2 mg tid prn anxiety and Wellbutrin SR 200 mg bid. She was in monthly counseling with [name]. Next medication management visit in 2 months.The plan was discussed with patient who had an opportunity to ask questions and these were all answered. I spend25 minutes inphone consultation with the patient.    Stephanie Acre, MD 04/01/2019, 9:12 AM

## 2019-04-03 ENCOUNTER — Other Ambulatory Visit: Payer: Self-pay

## 2019-04-03 ENCOUNTER — Ambulatory Visit (INDEPENDENT_AMBULATORY_CARE_PROVIDER_SITE_OTHER): Payer: 59 | Admitting: Psychiatry

## 2019-04-03 ENCOUNTER — Encounter (HOSPITAL_COMMUNITY): Payer: Self-pay | Admitting: Psychiatry

## 2019-04-03 DIAGNOSIS — F330 Major depressive disorder, recurrent, mild: Secondary | ICD-10-CM

## 2019-04-03 DIAGNOSIS — Z634 Disappearance and death of family member: Secondary | ICD-10-CM

## 2019-04-03 DIAGNOSIS — F064 Anxiety disorder due to known physiological condition: Secondary | ICD-10-CM

## 2019-04-03 NOTE — Progress Notes (Signed)
Virtual Visit via Video Note  I connected with Leslie Robinson on 04/03/19 at  1:00 PM EST by a video enabled telemedicine application and verified that I am speaking with the correct person using two identifiers.  Location: Patient: Leslie Robinson Provider: [name], LCSW   I discussed the limitations of evaluation and management by telemedicine and the availability of in person appointments. The patient expressed understanding and agreed to proceed.  History of Present Illness: MDD, Anxiety due to medical condition and death of partner.    Observations/Objective: Counselor met with Leslie Robinson for individual therapy via Webex. Counselor assessed MH symptoms and progress on treatment plan goals. Leslie Robinson presents with moderate depression and moderate anxiety. Leslie Robinson denied suicidal ideation or self-harm behaviors. Leslie Robinson shared that she has been experiencing increased depressive symptoms, such as sadness, crying, difficulty with getting out of bed, lethargy, lack of motivation and energy. Counselor explored contributing factors to the increased symptoms, with Leslie Robinson identifying that she is experiencing grief and loss of her husband and the loss of her income due to medical disability. Counselor processed Leslie Robinson's concerns regarding the differences in celebrating the holidays, managing her emotions for her children, how to provide experiences with no income, and how to create different and new traditions with her children considering the situation and their stages of development. Counselor explored and Leslie Robinson identified potentially useful coping skills and strategies for addressing the depression. Counselor assessed daily functioning regarding memory function and physical health.  Leslie Robinson continues to have difficulties with memory, has experienced two seizure like episodes since out last session and continues to tire easily when doing simple household chores. Leslie Robinson is continuing to follow recommendations and take medications as  prescribed. Leslie Robinson discussed concerns about disability process and Counselor advised to advocate for needs with support of POA. Leslie Robinson is interested in engaging in support groups with [place], and/or Hospice to address depression and grief.   Assessment and Plan: Counselor will continue to meet with patient to address treatment plan goals. Patient will continue to follow recommendations of providers and implement skills learned in session.  Follow Up Instructions: Counselor will send information for next session via Webex.     I discussed the assessment and treatment plan with the patient. The patient was provided an opportunity to ask questions and all were answered. The patient agreed with the plan and demonstrated an understanding of the instructions.   The patient was advised to call back or seek an in-person evaluation if the symptoms worsen or if the condition fails to improve as anticipated.  I provided 55 minutes of non-face-to-face time during this encounter.   [name], LCSW

## 2019-04-29 ENCOUNTER — Encounter (HOSPITAL_COMMUNITY): Payer: Self-pay | Admitting: Psychiatry

## 2019-04-29 ENCOUNTER — Ambulatory Visit (INDEPENDENT_AMBULATORY_CARE_PROVIDER_SITE_OTHER): Payer: 59 | Admitting: Psychiatry

## 2019-04-29 ENCOUNTER — Other Ambulatory Visit: Payer: Self-pay

## 2019-04-29 DIAGNOSIS — F330 Major depressive disorder, recurrent, mild: Secondary | ICD-10-CM

## 2019-04-29 DIAGNOSIS — F064 Anxiety disorder due to known physiological condition: Secondary | ICD-10-CM

## 2019-04-29 NOTE — Progress Notes (Signed)
Virtual Visit via Video Note  I connected with Leslie Robinson on 04/29/19 at  1:00 PM EST by a video enabled telemedicine application and verified that I am speaking with the correct person using two identifiers.  Location: Patient: Leslie Robinson Provider: [name], LCSW   I discussed the limitations of evaluation and management by telemedicine and the availability of in person appointments. The patient expressed understanding and agreed to proceed.  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 Counselor assessed MH symptoms and progress on treatment plan goals. Leslie Robinson presents with moderate to severe depression and moderate anxiety. Leslie Robinson denied suicidal ideation or self-harm behaviors, however she expressed thoughts and verbalizations to close family members that she feels "useless" and "like a burden". Leslie Robinson shared that the inability to work, receive effective medical treatment and grief and loss issues are causing her to feel severely depressed and hopeless. Counselor provided psychotherapeutic interventions as they explored feels of inadequacy, economical impact of illness, relationship dynamics with her children and experiencing the holiday's without her late husband. Counselor explored potential use of coping strategies, such as thought journals, connecting with support groups, assertive communication and boundary setting, and application of self-care. Counselor explored support system and daily functioning. Leslie Robinson to apply strategies discussed in session and meet with psychiatrist before next session.   Assessment and Plan: Counselor will continue to meet with patient to address treatment plan goals. Patient will continue to follow recommendations of providers and implement skills learned in session.  Follow Up Instructions: Counselor will send information for next session via Webex.      I discussed the assessment and treatment plan with the patient. The patient was provided an opportunity to ask questions and all were answered. The patient agreed with the plan and demonstrated an understanding of the instructions.   The patient was advised to call back or seek an in-person evaluation if the symptoms worsen or if the condition fails to improve as anticipated.  I provided 60 minutes of non-face-to-face time during this encounter.   [name], LCSW

## 2019-05-27 ENCOUNTER — Other Ambulatory Visit: Payer: Self-pay

## 2019-05-27 ENCOUNTER — Ambulatory Visit (HOSPITAL_COMMUNITY): Payer: 59 | Admitting: Psychiatry

## 2019-05-29 ENCOUNTER — Ambulatory Visit (INDEPENDENT_AMBULATORY_CARE_PROVIDER_SITE_OTHER): Payer: 59 | Admitting: Psychiatry

## 2019-05-29 ENCOUNTER — Other Ambulatory Visit: Payer: Self-pay

## 2019-05-29 DIAGNOSIS — F411 Generalized anxiety disorder: Secondary | ICD-10-CM | POA: Diagnosis not present

## 2019-05-29 DIAGNOSIS — F330 Major depressive disorder, recurrent, mild: Secondary | ICD-10-CM

## 2019-05-29 MED ORDER — ARIPIPRAZOLE 2 MG PO TABS: 2.0000 mg | ORAL_TABLET | Freq: Every day | ORAL | 0 refills | Status: DC

## 2019-05-29 MED ORDER — DIAZEPAM 2 MG PO TABS: 2.0000 mg | ORAL_TABLET | Freq: Three times a day (TID) | ORAL | 2 refills | Status: DC | PRN

## 2019-05-29 MED ORDER — TRAZODONE HCL 50 MG PO TABS: 50.0000 mg | ORAL_TABLET | Freq: Every evening | ORAL | 0 refills | Status: DC | PRN

## 2019-05-29 MED ORDER — VORTIOXETINE HBR 20 MG PO TABS: 20.0000 mg | ORAL_TABLET | Freq: Every day | ORAL | 2 refills | Status: DC

## 2019-05-29 MED ORDER — BUPROPION HCL ER (SR) 200 MG PO TB12: 200.0000 mg | ORAL_TABLET | Freq: Two times a day (BID) | ORAL | 0 refills | Status: DC

## 2019-05-29 NOTE — Progress Notes (Signed)
[place] MD/PA/NP OP Progress Note  05/29/2019 10:13 AM Leslie Robinson  MRN:  [id number] Interview was conducted by phone and I verified that I was speaking with the correct person using two identifiers. I discussed the limitations of evaluation and management by telemedicine and  the availability of in person appointments. Patient expressed understanding and agreed to proceed.  Chief Complaint: Ongoing depression, anxiety, forgetfulness.  HPI: 44yo widowed femalewith majordepression and anxiety (excessive worrying, occasional panic attacks) since loss of her father In 2003. She also lost her husband to ALS 16 months ago and is still grieving. She reportsfeelingmore depressed over last two weeks. Initial good response toTrintellix. Rarepanic attacks, sleep improved and so didconcentrationand energy (Wellbutrin dose was increased to 150 mg bid). She takestrazodone for insomnia and diazepam 2 mgasneeded for anxiety. [facility]was [facility] for episodic spells of weakness, numbness, speech slurring and headache and after workup was negative informed that these are in all likely hood psychogenic in nature (conversion episodes). She has accepted this explanation well.She did not have any of these episodes lately. Patient has applied for disability. We have increased  SR bupropion to 200 mg bid - she reports still feeling depressed (5-6 on a scale 0-10). Holiday time was particularly difficult. She needs to take diazepam daily, trazodone only on occasion. No psychosis, no SI.  Visit Diagnosis:    ICD-10-CM   1. Major depressive disorder, recurrent episode, mild (HCC)  F33.0   2. GAD (generalized anxiety disorder)  F41.1     Past Psychiatric History: Please see intake H&P.  Past Medical History:  Past Medical History:  Diagnosis Date  . Anemia   . Anxiety   . Asthma    childhood  . Depression   . Fatty liver   . Gallstones   . Gastric ulcer   . GERD (gastroesophageal reflux  disease)   . Gestational diabetes   . Heart murmur    "nothing to woorry about"  . Hiatal hernia   . Iron deficiency anemia   . Pneumonia 2017  . Restless leg   . Shingles   . Shortness of breath dyspnea    with exertion  . UTI (lower urinary tract infection)    frequently  . Vitamin D deficiency     Past Surgical History:  Procedure Laterality Date  . CHOLECYSTECTOMY N/A 02/14/2016   Procedure: LAPAROSCOPIC CHOLECYSTECTOMY;  Surgeon: Coralie Keens, MD;  Location: [place];  Service: General;  Laterality: N/A;  . CHOLECYSTECTOMY  02/14/2016  . TUBAL LIGATION  11-27-2014  . WISDOM TOOTH EXTRACTION      Family Psychiatric History: Reviewed.  Family History:  Family History  Problem Relation Age of Onset  . Hypertension Mother   . Colon polyps Mother   . Diabetes Mother   . Heart disease Mother   . Depression Mother   . Congestive Heart Failure Mother   . Dementia Mother   . Colon cancer Maternal Grandmother   . ADD / ADHD Daughter   . Asperger's syndrome Daughter   . Heart disease Maternal Grandfather   . Alzheimer's disease Paternal Grandmother     Social History:  Social History   Socioeconomic History  . Marital status: Widowed    Spouse name: Not on file  . Number of children: 3  . Years of education: Not on file  . Highest education level: Not on file  Occupational History  . Not on file  Tobacco Use  . Smoking status: Never Smoker  . Smokeless  tobacco: Never Used  Substance and Sexual Activity  . Alcohol use: Never    Alcohol/week: 0.0 standard drinks  . Drug use: Never  . Sexual activity: Not on file  Other Topics Concern  . Not on file  Social History Narrative   Lives at home with her 3 children   Right handed   Caffeine: very rare   Social Determinants of Health   Financial Resource Strain:   . Difficulty of Paying Living Expenses: Not on file  Food Insecurity:   . Worried About Charity fundraiser in the Last Year: Not on file  . Ran  Out of Food in the Last Year: Not on file  Transportation Needs:   . Lack of Transportation (Medical): Not on file  . Lack of Transportation (Non-Medical): Not on file  Physical Activity:   . Days of Exercise per Week: Not on file  . Minutes of Exercise per Session: Not on file  Stress:   . Feeling of Stress : Not on file  Social Connections:   . Frequency of Communication with Friends and Family: Not on file  . Frequency of Social Gatherings with Friends and Family: Not on file  . Attends Religious Services: Not on file  . Active Member of Clubs or Organizations: Not on file  . Attends Archivist Meetings: Not on file  . Marital Status: Not on file    Allergies:  Allergies  Allergen Reactions  . Tape Rash    Metabolic Disorder Labs: No results found for: HGBA1C, MPG No results found for: PROLACTIN No results found for: CHOL, TRIG, HDL, CHOLHDL, VLDL, LDLCALC Lab Results  Component Value Date   TSH 1.038 06/01/2018    Therapeutic Level Labs: No results found for: LITHIUM No results found for: VALPROATE No components found for:  CBMZ  Current Medications: Current Outpatient Medications  Medication Sig Dispense Refill  . ARIPiprazole (ABILIFY) 2 MG tablet Take 1 tablet (2 mg total) by mouth daily. 30 tablet 0  . buPROPion (WELLBUTRIN SR) 200 MG 12 hr tablet Take 1 tablet (200 mg total) by mouth 2 (two) times daily. 180 tablet 0  . diazepam (VALIUM) 2 MG tablet Take 1 tablet (2 mg total) by mouth 3 (three) times daily as needed for anxiety. 90 tablet 2  . lisinopril (PRINIVIL,ZESTRIL) 20 MG tablet Take 20 mg by mouth daily.    . montelukast (SINGULAIR) 10 MG tablet Take 10 mg by mouth at bedtime.    . pramipexole (MIRAPEX) 1 MG tablet Take 1 tablet (1 mg total) by mouth at bedtime. Reported on 06/09/2015 90 tablet 0  . traZODone (DESYREL) 50 MG tablet Take 1 tablet (50 mg total) by mouth at bedtime as needed for sleep. 90 tablet 0  . vortioxetine HBr (TRINTELLIX)  20 MG TABS tablet Take 1 tablet (20 mg total) by mouth daily. 30 tablet 2   No current facility-administered medications for this visit.      Psychiatric Specialty Exam: Review of Systems  Psychiatric/Behavioral: The patient is nervous/anxious.   All other systems reviewed and are negative.   There were no vitals taken for this visit.There is no height or weight on file to calculate BMI.  General Appearance: NA  Eye Contact:  NA  Speech:  Clear and Coherent and Normal Rate  Volume:  Normal  Mood:  Anxious and Depressed  Affect:  NA  Thought Process:  Goal Directed and Linear  Orientation:  Full (Time, Place, and Person)  Thought  Content: Logical   Suicidal Thoughts:  No  Homicidal Thoughts:  No  Memory:  Immediate;   Good Recent;   Good Remote;   Good  Judgement:  Good  Insight:  Good  Psychomotor Activity:  NA  Concentration:  Concentration: Fair  Recall:  Good  Fund of Knowledge: Good  Language: Good  Akathisia:  Negative  Handed:  Right  AIMS (if indicated): not done  Assets:  Communication Skills Desire for Improvement Financial Resources/Insurance Housing Resilience  ADL's:  Intact  Cognition: WNL  Sleep:  Fair   Screenings: Mini-Mental     Office Visit from 07/22/2018 in [facility]  Total Score (max 30 points )  26       Assessment and Plan: 43yo widowed femalewith majordepression and anxiety (excessive worrying, occasional panic attacks) since loss of her father In 2003. She also lost her husband to ALS 16 months ago and is still grieving. She reportsfeelingmore depressed over last two weeks. Initial good response toTrintellix. Rarepanic attacks, sleep improved and so didconcentrationand energy (Wellbutrin dose was increased to 150 mg bid). She takestrazodone for insomnia and diazepam 2 mgasneeded for anxiety. [facility]was [facility] for episodic spells of weakness, numbness, speech slurring and headache and after  workup was negative informed that these are in all likely hood psychogenic in nature (conversion episodes). She has accepted this explanation well.She did not have any of these episodes lately. Patient has applied for disability. We have increased  SR bupropion to 200 mg bid - she reports still feeling depressed (5-6 on a scale 0-10). Holiday time was particularly difficult. She needs to take diazepam daily, trazodone only on occasion. No psychosis, no SI.  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 The plan was discussed with patient who had an opportunity to ask questions and these were all answered. I spend25 minutes inphone consultation with the patient.    Stephanie Acre, MD 05/29/2019, 10:13 AM

## 2019-05-30 ENCOUNTER — Telehealth (HOSPITAL_COMMUNITY): Payer: Self-pay | Admitting: *Deleted

## 2019-05-30 NOTE — Telephone Encounter (Signed)
Received message from pt stating that co pay for Trintellix is $50 but pharmacy told her she can get Brintellix for "cheaper". Please review and advise.

## 2019-05-31 NOTE — Telephone Encounter (Signed)
Brintellix is the  "old" name for Trintellix which no longer is in use. It has been changed over 10 years ago to avoid confusion with other medication names. I have no idea what the pharmacist is talking about?

## 2019-06-02 ENCOUNTER — Telehealth (HOSPITAL_COMMUNITY): Payer: Self-pay | Admitting: *Deleted

## 2019-06-02 NOTE — Telephone Encounter (Signed)
I am not against changing to Brintellix if her insurance says they recommend it. I just do not know how to do it - Brintellix does not exist in Epic! If you could find out from her pharmacy how we can order it? She has tried SSRIs/SNRIs without much benefit. We could try vilazodone (Viibryd) but it will likely be as expensive as it does not have a generic version to my knowledge.

## 2019-06-02 NOTE — Telephone Encounter (Signed)
Writer spoke with pt regarding Trintellix and phaamacy and insurance company recommending Brintellix. Pt states that she is aware Brintellix is forerunner to Trintellix. Pt says that she cannot afford the $50 per month co-pay for this medication: Trintellix. Pt is asking if she may try another less expensive.medication  Pt upcoming appointment [id number]. Just seen on 01/07021. Please review and advise.

## 2019-06-10 ENCOUNTER — Other Ambulatory Visit: Payer: Self-pay

## 2019-06-10 ENCOUNTER — Encounter (HOSPITAL_COMMUNITY): Payer: Self-pay | Admitting: Psychiatry

## 2019-06-10 ENCOUNTER — Ambulatory Visit (HOSPITAL_COMMUNITY): Payer: 59 | Admitting: Psychiatry

## 2019-06-10 NOTE — Progress Notes (Signed)
Counselor provided a Webex link with 2 reminders, as well as leaving a vm to connect with [name] for our session today. She did not log on to Webex after 20 minutes, nor call back.

## 2019-06-26 ENCOUNTER — Other Ambulatory Visit (HOSPITAL_COMMUNITY): Payer: Self-pay | Admitting: *Deleted

## 2019-06-26 MED ORDER — ARIPIPRAZOLE 2 MG PO TABS: 2.0000 mg | ORAL_TABLET | Freq: Every day | ORAL | 0 refills | Status: DC

## 2019-06-30 ENCOUNTER — Other Ambulatory Visit: Payer: Self-pay

## 2019-06-30 ENCOUNTER — Ambulatory Visit (INDEPENDENT_AMBULATORY_CARE_PROVIDER_SITE_OTHER): Payer: 59 | Admitting: Psychiatry

## 2019-06-30 DIAGNOSIS — F411 Generalized anxiety disorder: Secondary | ICD-10-CM | POA: Diagnosis not present

## 2019-06-30 DIAGNOSIS — F330 Major depressive disorder, recurrent, mild: Secondary | ICD-10-CM

## 2019-06-30 MED ORDER — DESVENLAFAXINE SUCCINATE ER 50 MG PO TB24: 50.0000 mg | ORAL_TABLET | Freq: Every day | ORAL | 1 refills | Status: DC

## 2019-06-30 NOTE — Progress Notes (Signed)
[place] MD/PA/NP OP Progress Note  06/30/2019 9:40 AM Kynzee Terzian  MRN:  [id number] Interview was conducted by phone and I verified that I was speaking with the correct person using two identifiers. I discussed the limitations of evaluation and management by telemedicine and  the availability of in person appointments. Patient expressed understanding and agreed to proceed.  Chief Complaint: Residual depression.  HPI: 44yo widowed femalewith majordepression and anxiety (excessive worrying, occasional panic attacks) since loss of her father In 2003. She also lost her husband to ALS 2 years ago. Initial good response toTrintellix. Rarepanic attacks, sleep improved and so didconcentrationand energy (Wellbutrin dose was increased to 150 mg bid). She takestrazodone for insomnia and diazepam 2 mgasneeded for anxiety. [facility]was [facility] for episodic spells of weakness, numbness, speech slurring and headache and after workup was negative informed that these are in all likely hood psychogenic in nature (conversion episodes). She has accepted this explanation well.Shedid not have any of theseepisodeslately. Patienthasappliedfor disability. We have increased SR bupropion to 200 mg bid - she reports still feeling depressed. Holiday time was particularly difficult. She needs to take diazepam daily, trazodone only on occasion. No psychosis, no SI. She has a $50 copayment on Trintellix and we were discussing other antidepressant options. She has tried various SSRIs (Prozac, Zoloft, Celexa, Lexapro) as well as Cymbalta before.  Visit Diagnosis:    ICD-10-CM   1. GAD (generalized anxiety disorder)  F41.1   2. Major depressive disorder, recurrent episode, mild (HCC)  F33.0     Past Psychiatric History: Please see intake H&P.  Past Medical History:  Past Medical History:  Diagnosis Date  . Anemia   . Anxiety   . Asthma    childhood  . Depression   . Fatty liver   . Gallstones   .  Gastric ulcer   . GERD (gastroesophageal reflux disease)   . Gestational diabetes   . Heart murmur    "nothing to woorry about"  . Hiatal hernia   . Iron deficiency anemia   . Pneumonia 2017  . Restless leg   . Shingles   . Shortness of breath dyspnea    with exertion  . UTI (lower urinary tract infection)    frequently  . Vitamin D deficiency     Past Surgical History:  Procedure Laterality Date  . CHOLECYSTECTOMY N/A 02/14/2016   Procedure: LAPAROSCOPIC CHOLECYSTECTOMY;  Surgeon: Coralie Keens, MD;  Location: [place];  Service: General;  Laterality: N/A;  . CHOLECYSTECTOMY  02/14/2016  . TUBAL LIGATION  11-27-2014  . WISDOM TOOTH EXTRACTION      Family Psychiatric History: Reviewed.  Family History:  Family History  Problem Relation Age of Onset  . Hypertension Mother   . Colon polyps Mother   . Diabetes Mother   . Heart disease Mother   . Depression Mother   . Congestive Heart Failure Mother   . Dementia Mother   . Colon cancer Maternal Grandmother   . ADD / ADHD Daughter   . Asperger's syndrome Daughter   . Heart disease Maternal Grandfather   . Alzheimer's disease Paternal Grandmother     Social History:  Social History   Socioeconomic History  . Marital status: Widowed    Spouse name: Not on file  . Number of children: 3  . Years of education: Not on file  . Highest education level: Not on file  Occupational History  . Not on file  Tobacco Use  . Smoking status: Never Smoker  .  Smokeless tobacco: Never Used  Substance and Sexual Activity  . Alcohol use: Never    Alcohol/week: 0.0 standard drinks  . Drug use: Never  . Sexual activity: Not on file  Other Topics Concern  . Not on file  Social History Narrative   Lives at home with her 3 children   Right handed   Caffeine: very rare   Social Determinants of Health   Financial Resource Strain:   . Difficulty of Paying Living Expenses: Not on file  Food Insecurity:   . Worried About Ship broker in the Last Year: Not on file  . Ran Out of Food in the Last Year: Not on file  Transportation Needs:   . Lack of Transportation (Medical): Not on file  . Lack of Transportation (Non-Medical): Not on file  Physical Activity:   . Days of Exercise per Week: Not on file  . Minutes of Exercise per Session: Not on file  Stress:   . Feeling of Stress : Not on file  Social Connections:   . Frequency of Communication with Friends and Family: Not on file  . Frequency of Social Gatherings with Friends and Family: Not on file  . Attends Religious Services: Not on file  . Active Member of Clubs or Organizations: Not on file  . Attends Archivist Meetings: Not on file  . Marital Status: Not on file    Allergies:  Allergies  Allergen Reactions  . Tape Rash    Metabolic Disorder Labs: No results found for: HGBA1C, MPG No results found for: PROLACTIN No results found for: CHOL, TRIG, HDL, CHOLHDL, VLDL, LDLCALC Lab Results  Component Value Date   TSH 1.038 06/01/2018    Therapeutic Level Labs: No results found for: LITHIUM No results found for: VALPROATE No components found for:  CBMZ  Current Medications: Current Outpatient Medications  Medication Sig Dispense Refill  . ARIPiprazole (ABILIFY) 2 MG tablet Take 1 tablet (2 mg total) by mouth daily. 30 tablet 0  . buPROPion (WELLBUTRIN SR) 200 MG 12 hr tablet Take 1 tablet (200 mg total) by mouth 2 (two) times daily. 180 tablet 0  . desvenlafaxine (PRISTIQ) 50 MG 24 hr tablet Take 1 tablet (50 mg total) by mouth daily. 30 tablet 1  . diazepam (VALIUM) 2 MG tablet Take 1 tablet (2 mg total) by mouth 3 (three) times daily as needed for anxiety. 90 tablet 2  . lisinopril (PRINIVIL,ZESTRIL) 20 MG tablet Take 20 mg by mouth daily.    . montelukast (SINGULAIR) 10 MG tablet Take 10 mg by mouth at bedtime.    . pramipexole (MIRAPEX) 1 MG tablet Take 1 tablet (1 mg total) by mouth at bedtime. Reported on 06/09/2015 90 tablet 0   . traZODone (DESYREL) 50 MG tablet Take 1 tablet (50 mg total) by mouth at bedtime as needed for sleep. 90 tablet 0   No current facility-administered medications for this visit.      Psychiatric Specialty Exam: Review of Systems  Psychiatric/Behavioral: The patient is nervous/anxious.   All other systems reviewed and are negative.   There were no vitals taken for this visit.There is no height or weight on file to calculate BMI.  General Appearance: NA  Eye Contact:  NA  Speech:  Clear and Coherent and Normal Rate  Volume:  Normal  Mood:  Some residual anxiety and depression.  Affect:  NA  Thought Process:  Goal Directed and Linear  Orientation:  Full (Time, Place, and  Person)  Thought Content: Logical   Suicidal Thoughts:  No  Homicidal Thoughts:  No  Memory:  Immediate;   Good Recent;   Good Remote;   Good  Judgement:  Good  Insight:  Fair  Psychomotor Activity:  NA  Concentration:  Concentration: Good  Recall:  Good  Fund of Knowledge: Good  Language: Good  Akathisia:  Negative  Handed:  Right  AIMS (if indicated): not done  Assets:  Communication Skills Desire for Improvement Financial Resources/Insurance Housing Resilience Talents/Skills  ADL's:  Intact  Cognition: WNL  Sleep:  Good   Screenings: Mini-Mental     Office Visit from 07/22/2018 in [facility]  Total Score (max 30 points )  26       Assessment and Plan: 43yo widowed femalewith majordepression and anxiety (excessive worrying, occasional panic attacks) since loss of her father In 2003. She also lost her husband to ALS 2 years ago. Initial good response toTrintellix. Rarepanic attacks, sleep improved and so didconcentrationand energy (Wellbutrin dose was increased to 150 mg bid). She takestrazodone for insomnia and diazepam 2 mgasneeded for anxiety. [facility]was [facility] for episodic spells of weakness, numbness, speech slurring and headache and after workup  was negative informed that these are in all likely hood psychogenic in nature (conversion episodes). She has accepted this explanation well.Shedid not have any of theseepisodeslately. Patienthasappliedfor disability. We have increased SR bupropion to 200 mg bid - she reports still feeling depressed. Holiday time was particularly difficult. She needs to take diazepam daily, trazodone only on occasion. No psychosis, no SI. She has a $50 copayment on Trintellix and we were discussing other antidepressant options. She has tried various SSRIs (Prozac, Zoloft, Celexa, Lexapro) as well as Cymbalta before.  Dx: MDD recurrent mild to moderate; GAD; Conversion disorder  Plan:We willcross taper Trintellix20mg  to Pristiq 50 mg next month and continue trazodone 50 mg prn insomnia,diazepam 2 mg tid prn anxiety,Wellbutrin SR 200 mg bid as well as 2 mg dose of aripiprazole to augment antidepressants. Sheis inmonthlycounseling with [facility]. Next appointment with me in 2 months.The plan was discussed with patient who had an opportunity to ask questions and these were all answered. I spend25 minutes inphone consultation with the patient.    Stephanie Acre, MD 06/30/2019, 9:40 AM

## 2019-07-23 ENCOUNTER — Other Ambulatory Visit (HOSPITAL_COMMUNITY): Payer: Self-pay | Admitting: Psychiatry

## 2019-07-23 ENCOUNTER — Telehealth: Payer: Self-pay

## 2019-07-23 MED ORDER — ARIPIPRAZOLE 2 MG PO TABS: 2.0000 mg | ORAL_TABLET | Freq: Every day | ORAL | 2 refills | Status: DC

## 2019-07-23 NOTE — Telephone Encounter (Signed)
Just send 3 months of refills.

## 2019-07-23 NOTE — Telephone Encounter (Signed)
Medication refill - Fax received from pt's [facility] for a refill of her prescribed Abilify, last provided for one time 06/26/19 and patient does not return until 08/26/19.

## 2019-08-26 ENCOUNTER — Ambulatory Visit (INDEPENDENT_AMBULATORY_CARE_PROVIDER_SITE_OTHER): Payer: 59 | Admitting: Psychiatry

## 2019-08-26 ENCOUNTER — Other Ambulatory Visit: Payer: Self-pay

## 2019-08-26 DIAGNOSIS — F411 Generalized anxiety disorder: Secondary | ICD-10-CM

## 2019-08-26 DIAGNOSIS — F330 Major depressive disorder, recurrent, mild: Secondary | ICD-10-CM | POA: Diagnosis not present

## 2019-08-26 MED ORDER — BUPROPION HCL ER (SR) 200 MG PO TB12: 200.0000 mg | ORAL_TABLET | Freq: Two times a day (BID) | ORAL | 0 refills | Status: DC

## 2019-08-26 MED ORDER — DIAZEPAM 2 MG PO TABS: 2.0000 mg | ORAL_TABLET | Freq: Three times a day (TID) | ORAL | 2 refills | Status: DC | PRN

## 2019-08-26 MED ORDER — DESVENLAFAXINE SUCCINATE ER 50 MG PO TB24: 50.0000 mg | ORAL_TABLET | Freq: Every day | ORAL | 2 refills | Status: DC

## 2019-08-26 MED ORDER — ARIPIPRAZOLE 2 MG PO TABS: 2.0000 mg | ORAL_TABLET | Freq: Every day | ORAL | 2 refills | Status: DC

## 2019-08-26 MED ORDER — TRAZODONE HCL 50 MG PO TABS: 50.0000 mg | ORAL_TABLET | Freq: Every evening | ORAL | 0 refills | Status: DC | PRN

## 2019-08-26 NOTE — Progress Notes (Signed)
BH MD/PA/NP OP Progress Note  08/26/2019 9:41 AM Saryn Panick  MRN:  [id number] Interview was conducted by phone and I verified that I was speaking with the correct person using two identifiers. I discussed the limitations of evaluation and management by telemedicine and  the availability of in person appointments. Patient expressed understanding and agreed to proceed.  Chief Complaint: Fatigue.  HPI: 44yo widowed femalewith majordepression and anxiety (excessive worrying, occasional panic attacks) since loss of her father In 2003. She also lost her husband to ALS 2 years ago. Initial good response toTrintellix. Rarepanic attacks, sleep improved and so didconcentrationand energy (Wellbutrin dose was increased to 150 mg bid). She takestrazodone for insomnia and diazepam 2 mgasneeded for anxiety. 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 She has accepted this explanation well.Shedid not have any of theseepisodeslately. Patienthasappliedfor disability. We have increased SR bupropion to 200 mg bid - she reportsstill feeling depressed. Holiday time was particularly difficult. She needs to take diazepam daily, trazodone only on occasion. No psychosis, no SI. She has a $50 copayment on Trintellix and we were discussing other antidepressant options. She has tried various SSRIs (Prozac, Zoloft, Celexa, Lexapro) as well as Cymbalta before. We recently switched to [place] and it work well for depression but she c/o fatigue.  Visit Diagnosis:    ICD-10-CM   1. Major depressive disorder, recurrent episode, mild (HCC)  F33.0   2. GAD (generalized anxiety disorder)  F41.1     Past Psychiatric History: Please see intake H&P.  Past Medical History:  Past Medical History:  Diagnosis Date  . Anemia   . Anxiety   .  Asthma    childhood  . Depression   . Fatty liver   . Gallstones   . Gastric ulcer   . GERD (gastroesophageal reflux disease)   . Gestational diabetes   . Heart murmur    "nothing to woorry about"  . Hiatal hernia   . Iron deficiency anemia   . Pneumonia 2017  . Restless leg   . Shingles   . Shortness of breath dyspnea    with exertion  . UTI (lower urinary tract infection)    frequently  . Vitamin D deficiency     Past Surgical History:  Procedure Laterality Date  . CHOLECYSTECTOMY N/A 02/14/2016   Procedure: LAPAROSCOPIC CHOLECYSTECTOMY;  Surgeon: Coralie Keens, MD;  Location: [facility];  Service: General;  Laterality: N/A;  . CHOLECYSTECTOMY  02/14/2016  . TUBAL LIGATION  11-27-2014  . WISDOM TOOTH EXTRACTION      Family Psychiatric History: Reviewed.  Family History:  Family History  Problem Relation Age of Onset  . Hypertension Mother   . Colon polyps Mother   . Diabetes Mother   . Heart disease Mother   . Depression Mother   . Congestive Heart Failure Mother   . Dementia Mother   . Colon cancer Maternal Grandmother   . ADD / ADHD Daughter   . Asperger's syndrome Daughter   . Heart disease Maternal Grandfather   . Alzheimer's disease Paternal Grandmother     Social History:  Social History   Socioeconomic History  . Marital status: Widowed    Spouse name: Not on file  . Number of children: 3  . Years of education: Not on file  . Highest education level: Not on file  Occupational History  .  Not on file  Tobacco Use  . Smoking status: Never Smoker  . Smokeless tobacco: Never Used  Substance and Sexual Activity  . Alcohol use: Never    Alcohol/week: 0.0 standard drinks  . Drug use: Never  . Sexual activity: Not on file  Other Topics Concern  . Not on file  Social History Narrative   Lives at home with her 3 children   Right handed   Caffeine: very rare   Social Determinants of Health   Financial Resource Strain:   . Difficulty of Paying  Living Expenses:   Food Insecurity:   . Worried About Charity fundraiser in the Last Year:   . Arboriculturist in the Last Year:   Transportation Needs:   . Film/video editor (Medical):   Marland Kitchen Lack of Transportation (Non-Medical):   Physical Activity:   . Days of Exercise per Week:   . Minutes of Exercise per Session:   Stress:   . Feeling of Stress :   Social Connections:   . Frequency of Communication with Friends and Family:   . Frequency of Social Gatherings with Friends and Family:   . Attends Religious Services:   . Active Member of Clubs or Organizations:   . Attends Archivist Meetings:   Marland Kitchen Marital Status:     Allergies:  Allergies  Allergen Reactions  . Tape Rash    Metabolic Disorder Labs: No results found for: HGBA1C, MPG No results found for: PROLACTIN No results found for: CHOL, TRIG, HDL, CHOLHDL, VLDL, LDLCALC Lab Results  Component Value Date   TSH 1.038 06/01/2018    Therapeutic Level Labs: No results found for: LITHIUM No results found for: VALPROATE No components found for:  CBMZ  Current Medications: Current Outpatient Medications  Medication Sig Dispense Refill  . ARIPiprazole (ABILIFY) 2 MG tablet Take 1 tablet (2 mg total) by mouth daily. 30 tablet 2  . buPROPion (WELLBUTRIN SR) 200 MG 12 hr tablet Take 1 tablet (200 mg total) by mouth 2 (two) times daily. 180 tablet 0  . desvenlafaxine (PRISTIQ) 50 MG 24 hr tablet Take 1 tablet (50 mg total) by mouth daily at 6 PM. 30 tablet 2  . diazepam (VALIUM) 2 MG tablet Take 1 tablet (2 mg total) by mouth 3 (three) times daily as needed for anxiety. 90 tablet 2  . lisinopril (PRINIVIL,ZESTRIL) 20 MG tablet Take 20 mg by mouth daily.    . montelukast (SINGULAIR) 10 MG tablet Take 10 mg by mouth at bedtime.    . pramipexole (MIRAPEX) 1 MG tablet Take 1 tablet (1 mg total) by mouth at bedtime. Reported on 06/09/2015 90 tablet 0  . traZODone (DESYREL) 50 MG tablet Take 1 tablet (50 mg total) by  mouth at bedtime as needed for sleep. 90 tablet 0   No current facility-administered medications for this visit.     Psychiatric Specialty Exam: Review of Systems  Constitutional: Positive for fatigue.  Psychiatric/Behavioral: The patient is nervous/anxious.   All other systems reviewed and are negative.   There were no vitals taken for this visit.There is no height or weight on file to calculate BMI.  General Appearance: NA  Eye Contact:  NA  Speech:  Clear and Coherent and Normal Rate  Volume:  Normal  Mood:  Anxious  Affect:  NA  Thought Process:  Goal Directed and Linear  Orientation:  Full (Time, Place, and Person)  Thought Content: Logical   Suicidal Thoughts:  No  Homicidal Thoughts:  No  Memory:  Immediate;   Good Recent;   Good Remote;   Good  Judgement:  Good  Insight:  Good  Psychomotor Activity:  NA  Concentration:  Concentration: Good  Recall:  Good  Fund of Knowledge: Good  Language: Good  Akathisia:  Negative  Handed:  Right  AIMS (if indicated): not done  Assets:  Communication Skills Desire for Improvement Financial Resources/Insurance Housing Social Support Talents/Skills  ADL's:  Intact  Cognition: WNL  Sleep:  Good   Screenings: Mini-Mental     Office Visit from 07/22/2018 in [facility]  Total Score (max 30 points )  26       Assessment and Plan: 43yo widowed femalewith majordepression and anxiety (excessive worrying, occasional panic attacks) since loss of her father In 2003. She also lost her husband to ALS 2 years ago. Initial good response toTrintellix. Rarepanic attacks, sleep improved and so didconcentrationand energy (Wellbutrin dose was increased to 150 mg bid). She takestrazodone for insomnia and diazepam 2 mgasneeded for anxiety. [facility]was [facility] by [facility] for episodic spells of weakness, numbness, speech slurring and headache and after workup was negative informed that these are in all likely hood  psychogenic in nature (conversion episodes). She has accepted this explanation well.Shedid not have any of theseepisodeslately. Patienthasappliedfor disability. We have increased SR bupropion to 200 mg bid - she reportsstill feeling depressed. Holiday time was particularly difficult. She needs to take diazepam daily, trazodone only on occasion. No psychosis, no SI. She has a $50 copayment on Trintellix and we were discussing other antidepressant options. She has tried various SSRIs (Prozac, Zoloft, Celexa, Lexapro) as well as Cymbalta before. We recently switched to [place] and it work well for depression but she c/o fatigue.  Dx: MDD recurrent mild; GAD; Conversion disorder by hx  Plan:We willmove Pristiq to evening hours and continue trazodone 50 mg prn insomnia,diazepam 2 mg tid prn anxiety,Wellbutrin SR 200 mg bid as well as 2 mg dose of aripiprazole to augment antidepressants. Next appointment with me in 2 months.The plan was discussed with patient who had an opportunity to ask questions and these were all answered. I spend20 minutes inphone consultation with the patient.    Stephanie Acre, MD 08/26/2019, 9:41 AM

## 2019-11-10 ENCOUNTER — Telehealth (INDEPENDENT_AMBULATORY_CARE_PROVIDER_SITE_OTHER): Payer: Medicaid - Out of State | Admitting: Psychiatry

## 2019-11-10 ENCOUNTER — Other Ambulatory Visit: Payer: Self-pay

## 2019-11-10 DIAGNOSIS — F411 Generalized anxiety disorder: Secondary | ICD-10-CM

## 2019-11-10 DIAGNOSIS — F330 Major depressive disorder, recurrent, mild: Secondary | ICD-10-CM

## 2019-11-10 MED ORDER — DESVENLAFAXINE SUCCINATE ER 50 MG PO TB24: 50.0000 mg | ORAL_TABLET | Freq: Every day | ORAL | 2 refills | Status: DC

## 2019-11-10 MED ORDER — DIAZEPAM 2 MG PO TABS: 2.0000 mg | ORAL_TABLET | Freq: Three times a day (TID) | ORAL | 2 refills | Status: AC | PRN

## 2019-11-10 MED ORDER — TRAZODONE HCL 50 MG PO TABS: 50.0000 mg | ORAL_TABLET | Freq: Every evening | ORAL | 0 refills | Status: DC | PRN

## 2019-11-10 MED ORDER — TOPIRAMATE 25 MG PO TABS: 25.0000 mg | ORAL_TABLET | Freq: Two times a day (BID) | ORAL | 1 refills | Status: DC

## 2019-11-10 MED ORDER — BUPROPION HCL ER (SR) 200 MG PO TB12: 200.0000 mg | ORAL_TABLET | Freq: Two times a day (BID) | ORAL | 0 refills | Status: AC

## 2019-11-10 NOTE — Progress Notes (Signed)
[place] MD/PA/NP OP Progress Note  11/10/2019 9:48 AM Leslie Robinson  MRN:  [id number] Interview was conducted by phone and I verified that I was speaking with the correct person using two identifiers. I discussed the limitations of evaluation and management by telemedicine and  the availability of in person appointments. Patient expressed understanding and agreed to proceed. Patient location - home; physician - home office.  Chief Complaint: Anxiety, headaches.   HPI: 44yo widowed femalewith majordepression and anxiety (excessive worrying, occasional panic attacks) since loss of her father In 2003. She also lost her husband to ALS2 years ago. Initial good response toTrintellix. Rarepanic attacks, sleep improved and so didconcentrationand energy (Wellbutrin dose was increased to 150 mg bid). She takestrazodone for insomnia and diazepam 2 mgasneeded for anxiety. [facility]was [facility] by [facility] for episodic spells of weakness, numbness, speech slurring and headache and after workup was negative informed that these are in all likely hood psychogenic in nature (conversion episodes). She has recently seen Dr. Santo Held (OBGYN) who, ased on association of her "spells" with menstrual cycle, suggested a trial of topiramate but was reluctant to prescribe it unless we agree with that (drug - drug interactions). Patienthasappliedfor disability. We have increased SR bupropion to 200 mg bid - she reportsstill feeling depressed. Holiday time was particularly difficult. She needs to take diazepam daily, trazodone only on occasion. No psychosis, no SI.She has a $50 copayment on Trintellix and we were discussing other antidepressant options. She has tried various SSRIs (Prozac, Zoloft, Celexa, Lexapro) as well as Cymbalta before. We recently switched to [place] and it work well for depression.  Visit Diagnosis:    ICD-10-CM   1. GAD (generalized anxiety disorder)  F41.1   2. Major depressive disorder,  recurrent episode, mild (HCC)  F33.0     Past Psychiatric History: Please see intake H&P.  Past Medical History:  Past Medical History:  Diagnosis Date  . Anemia   . Anxiety   . Asthma    childhood  . Depression   . Fatty liver   . Gallstones   . Gastric ulcer   . GERD (gastroesophageal reflux disease)   . Gestational diabetes   . Heart murmur    "nothing to woorry about"  . Hiatal hernia   . Iron deficiency anemia   . Pneumonia 2017  . Restless leg   . Shingles   . Shortness of breath dyspnea    with exertion  . UTI (lower urinary tract infection)    frequently  . Vitamin D deficiency     Past Surgical History:  Procedure Laterality Date  . CHOLECYSTECTOMY N/A 02/14/2016   Procedure: LAPAROSCOPIC CHOLECYSTECTOMY;  Surgeon: Coralie Keens, MD;  Location: [facility];  Service: General;  Laterality: N/A;  . CHOLECYSTECTOMY  02/14/2016  . TUBAL LIGATION  11-27-2014  . WISDOM TOOTH EXTRACTION      Family Psychiatric History: Reviewed.  Family History:  Family History  Problem Relation Age of Onset  . Hypertension Mother   . Colon polyps Mother   . Diabetes Mother   . Heart disease Mother   . Depression Mother   . Congestive Heart Failure Mother   . Dementia Mother   . Colon cancer Maternal Grandmother   . ADD / ADHD Daughter   . Asperger's syndrome Daughter   . Heart disease Maternal Grandfather   . Alzheimer's disease Paternal Grandmother     Social History:  Social History   Socioeconomic History  . Marital status: Widowed  Spouse name: Not on file  . Number of children: 3  . Years of education: Not on file  . Highest education level: Not on file  Occupational History  . Not on file  Tobacco Use  . Smoking status: Never Smoker  . Smokeless tobacco: Never Used  Vaping Use  . Vaping Use: Never used  Substance and Sexual Activity  . Alcohol use: Never    Alcohol/week: 0.0 standard drinks  . Drug use: Never  . Sexual activity: Not on file  Other  Topics Concern  . Not on file  Social History Narrative   Lives at home with her 3 children   Right handed   Caffeine: very rare   Social Determinants of Health   Financial Resource Strain:   . Difficulty of Paying Living Expenses:   Food Insecurity:   . Worried About Charity fundraiser in the Last Year:   . Arboriculturist in the Last Year:   Transportation Needs:   . Film/video editor (Medical):   Marland Kitchen Lack of Transportation (Non-Medical):   Physical Activity:   . Days of Exercise per Week:   . Minutes of Exercise per Session:   Stress:   . Feeling of Stress :   Social Connections:   . Frequency of Communication with Friends and Family:   . Frequency of Social Gatherings with Friends and Family:   . Attends Religious Services:   . Active Member of Clubs or Organizations:   . Attends Archivist Meetings:   Marland Kitchen Marital Status:     Allergies:  Allergies  Allergen Reactions  . Tape Rash    Metabolic Disorder Labs: No results found for: HGBA1C, MPG No results found for: PROLACTIN No results found for: CHOL, TRIG, HDL, CHOLHDL, VLDL, LDLCALC Lab Results  Component Value Date   TSH 1.038 06/01/2018    Therapeutic Level Labs: No results found for: LITHIUM No results found for: VALPROATE No components found for:  CBMZ  Current Medications: Current Outpatient Medications  Medication Sig Dispense Refill  . [START ON 11/24/2019] buPROPion (WELLBUTRIN SR) 200 MG 12 hr tablet Take 1 tablet (200 mg total) by mouth 2 (two) times daily. 180 tablet 0  . [START ON 11/24/2019] desvenlafaxine (PRISTIQ) 50 MG 24 hr tablet Take 1 tablet (50 mg total) by mouth daily at 6 PM. 30 tablet 2  . [START ON 11/24/2019] diazepam (VALIUM) 2 MG tablet Take 1 tablet (2 mg total) by mouth 3 (three) times daily as needed for anxiety. 90 tablet 2  . lisinopril (PRINIVIL,ZESTRIL) 20 MG tablet Take 20 mg by mouth daily.    . montelukast (SINGULAIR) 10 MG tablet Take 10 mg by mouth at bedtime.     . pramipexole (MIRAPEX) 1 MG tablet Take 1 tablet (1 mg total) by mouth at bedtime. Reported on 06/09/2015 90 tablet 0  . topiramate (TOPAMAX) 25 MG tablet Take 1 tablet (25 mg total) by mouth 2 (two) times daily. 60 tablet 1  . [START ON 11/24/2019] traZODone (DESYREL) 50 MG tablet Take 1 tablet (50 mg total) by mouth at bedtime as needed for sleep. 90 tablet 0   No current facility-administered medications for this visit.      Psychiatric Specialty Exam: Review of Systems  Neurological: Positive for headaches.  Psychiatric/Behavioral: The patient is nervous/anxious.   All other systems reviewed and are negative.   There were no vitals taken for this visit.There is no height or weight on file to calculate  BMI.  General Appearance: NA  Eye Contact:  NA  Speech:  Clear and Coherent and Normal Rate  Volume:  Normal  Mood:  Anxious  Affect:  NA  Thought Process:  Goal Directed and Linear  Orientation:  Full (Time, Place, and Person)  Thought Content: Logical   Suicidal Thoughts:  No  Homicidal Thoughts:  No  Memory:  Immediate;   Good Recent;   Good Remote;   Good  Judgement:  Good  Insight:  Good  Psychomotor Activity:  NA  Concentration:  Concentration: Good  Recall:  Good  Fund of Knowledge: Good  Language: Good  Akathisia:  Negative  Handed:  Right  AIMS (if indicated): not done  Assets:  Communication Skills Desire for Improvement Financial Resources/Insurance Housing Resilience  ADL's:  Intact  Cognition: WNL  Sleep:  Fair   Screenings: Mini-Mental     Office Visit from 07/22/2018 in [facility]  Total Score (max 30 points ) 26       Assessment and Plan: 44yo widowed femalewith majordepression and anxiety (excessive worrying, occasional panic attacks) since loss of her father In 2003. She also lost her husband to ALS2 years ago. Initial good response toTrintellix. Rarepanic attacks, sleep improved and so didconcentrationand energy  (Wellbutrin dose was increased to 150 mg bid). She takestrazodone for insomnia and diazepam 2 mgasneeded for anxiety. Shewas seen by [facility] for episodic spells of weakness, numbness, speech slurring and headache and after workup was negative informed that these are in all likely hood psychogenic in nature (conversion episodes). She has recently seen Dr. Santo Held [facility]) who suggested a trial of topiramate but was reluctant to prescribe it unless we agree with that (drug - drug interactions). Patienthasappliedfor disability. We have increased SR bupropion to 200 mg bid - she reportsstill feeling depressed. Holiday time was particularly difficult. She needs to take diazepam daily, trazodone only on occasion. No psychosis, no SI.She has a $50 copayment on Trintellix and we were discussing other antidepressant options. She has tried various SSRIs (Prozac, Zoloft, Celexa, Lexapro) as well as Cymbalta before. We recently switched to [place] and it work well for depression.  Dx: MDD recurrent mild; GAD; Conversion disorder by hx  Plan:We willcontinue Pristiq 50 mg in PM, trazodone 50mg  prn insomnia,diazepam 2 mg tid prn anxiety,Wellbutrin SR 200 mg bidand dc 2 mg dose of aripiprazole. I will add 25 mg of topiramate bid at this time. She will return to [facility] specialist for possible dose increase while we will schedule her for a follow up in 2 months. The plan was discussed with patient who had an opportunity to ask questions and these were all answered. I spend20 minutes inphone consultation with the patient.    Stephanie Acre, MD 11/10/2019, 9:48 AM

## 2019-11-23 ENCOUNTER — Other Ambulatory Visit (HOSPITAL_COMMUNITY): Payer: Self-pay | Admitting: Psychiatry

## 2019-12-01 ENCOUNTER — Other Ambulatory Visit (HOSPITAL_COMMUNITY): Payer: Self-pay | Admitting: Psychiatry

## 2019-12-09 ENCOUNTER — Ambulatory Visit: Payer: Self-pay | Admitting: Nurse Practitioner

## 2020-01-09 ENCOUNTER — Other Ambulatory Visit: Payer: Self-pay

## 2020-01-09 ENCOUNTER — Telehealth (HOSPITAL_COMMUNITY): Payer: 59 | Admitting: Psychiatry

## 2020-01-24 ENCOUNTER — Other Ambulatory Visit (HOSPITAL_COMMUNITY): Payer: Self-pay | Admitting: Psychiatry

## 2020-03-10 ENCOUNTER — Other Ambulatory Visit (HOSPITAL_COMMUNITY): Payer: Self-pay | Admitting: Psychiatry

## 2020-03-11 ENCOUNTER — Other Ambulatory Visit (HOSPITAL_COMMUNITY): Payer: Self-pay | Admitting: Psychiatry

## 2020-05-01 ENCOUNTER — Other Ambulatory Visit (HOSPITAL_COMMUNITY): Payer: Self-pay | Admitting: Psychiatry

## 2020-05-14 ENCOUNTER — Telehealth (HOSPITAL_COMMUNITY): Payer: Self-pay | Admitting: Psychiatry

## 2020-05-14 NOTE — Telephone Encounter (Signed)
05/14/20 11:15am Called patient ddue to insurance - pt stated that she only  has [place] - explained to patient that we don't take that insurance and if she recd any bills the pt stated "no".Marland KitchenMariana Kaufman

## 2020-12-13 ENCOUNTER — Encounter: Payer: Self-pay | Admitting: Internal Medicine
